# Patient Record
Sex: Female | Born: 1975 | Race: White | Hispanic: No | Smoking: Former smoker
Health system: Southern US, Community
[De-identification: ages and names within clinical notes are randomized; demographics above are authoritative.]

## PROBLEM LIST (undated history)

## (undated) HISTORY — PX: OTHER SURGICAL HISTORY: SHX169

## (undated) HISTORY — PX: TUBAL LIGATION: SHX77

---
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
## 2006-08-13 ENCOUNTER — Encounter: Payer: Self-pay | Admitting: Family Medicine

## 2007-11-17 ENCOUNTER — Ambulatory Visit: Payer: Self-pay | Admitting: Family Medicine

## 2007-11-18 LAB — CONVERTED CEMR LAB
Albumin: 4.9 g/dL (ref 3.5–5.2)
CO2: 21 meq/L (ref 19–32)
Calcium: 9.4 mg/dL (ref 8.4–10.5)
Cholesterol: 119 mg/dL (ref 0–200)
Glucose, Bld: 85 mg/dL (ref 70–99)
LDL Cholesterol: 54 mg/dL (ref 0–99)
Potassium: 3.9 meq/L (ref 3.5–5.3)
Sodium: 139 meq/L (ref 135–145)
Total Protein: 8 g/dL (ref 6.0–8.3)
Triglycerides: 51 mg/dL (ref <150)

## 2008-01-25 ENCOUNTER — Ambulatory Visit: Payer: Self-pay | Admitting: Family Medicine

## 2008-01-25 DIAGNOSIS — B353 Tinea pedis: Secondary | ICD-10-CM

## 2008-02-26 ENCOUNTER — Ambulatory Visit: Payer: Self-pay | Admitting: Family Medicine

## 2008-04-01 ENCOUNTER — Ambulatory Visit: Payer: Self-pay | Admitting: Family Medicine

## 2008-04-02 ENCOUNTER — Encounter: Payer: Self-pay | Admitting: Family Medicine

## 2008-04-22 ENCOUNTER — Ambulatory Visit: Payer: Self-pay | Admitting: Family Medicine

## 2008-04-25 ENCOUNTER — Encounter: Payer: Self-pay | Admitting: Family Medicine

## 2008-07-18 ENCOUNTER — Telehealth: Payer: Self-pay | Admitting: Family Medicine

## 2008-07-18 DIAGNOSIS — J45990 Exercise induced bronchospasm: Secondary | ICD-10-CM | POA: Insufficient documentation

## 2008-08-25 ENCOUNTER — Ambulatory Visit: Payer: Self-pay | Admitting: Family Medicine

## 2008-08-25 DIAGNOSIS — M25569 Pain in unspecified knee: Secondary | ICD-10-CM

## 2008-08-29 ENCOUNTER — Telehealth: Payer: Self-pay | Admitting: Family Medicine

## 2008-09-15 ENCOUNTER — Encounter: Payer: Self-pay | Admitting: Family Medicine

## 2008-09-15 ENCOUNTER — Encounter: Admission: RE | Admit: 2008-09-15 | Discharge: 2008-09-15 | Payer: Self-pay | Admitting: Sports Medicine

## 2008-10-06 ENCOUNTER — Encounter: Admission: RE | Admit: 2008-10-06 | Discharge: 2008-11-02 | Payer: Self-pay | Admitting: Sports Medicine

## 2008-11-29 ENCOUNTER — Encounter: Payer: Self-pay | Admitting: Family Medicine

## 2009-09-12 ENCOUNTER — Ambulatory Visit: Payer: Self-pay | Admitting: Family Medicine

## 2009-09-12 DIAGNOSIS — K219 Gastro-esophageal reflux disease without esophagitis: Secondary | ICD-10-CM | POA: Insufficient documentation

## 2009-09-21 ENCOUNTER — Other Ambulatory Visit: Admission: RE | Admit: 2009-09-21 | Discharge: 2009-09-21 | Payer: Self-pay | Admitting: Family Medicine

## 2009-09-21 ENCOUNTER — Ambulatory Visit: Payer: Self-pay | Admitting: Family Medicine

## 2009-09-29 LAB — CONVERTED CEMR LAB
Pap Smear: NEGATIVE
Pap Smear: NORMAL

## 2010-03-06 NOTE — Assessment & Plan Note (Signed)
Summary: CPE   Vital Signs:  Patient profile:   35 year old female Height:      68 inches Weight:      160 pounds Pulse rate:   74 / minute BP sitting:   124 / 73  (left arm) Cuff size:   regular  Vitals Entered By: Avon Gully CMA, Duncan Dull) (September 21, 2009 4:12 PM) CC: cpe, pap   Primary Care Zawadi Aplin:  Nani Gasser MD  CC:  cpe and pap.  History of Present Illness: Her knee is better.   Current Medications (verified): 1)  Antivert 25 Mg Tabs (Meclizine Hcl) .... One By Mouth As Needed Eveyr 4-6 Hours Fo Rdizziness. 2)  Proventil Hfa 108 (90 Base) Mcg/act Aers (Albuterol Sulfate) .... 2 Puff Inhaed Every 4-6 Hours As Needed For Wheezing.  Allergies (verified): No Known Drug Allergies  Comments:  Nurse/Medical Assistant: The patient's medications and allergies were reviewed with the patient and were updated in the Medication and Allergy Lists. Avon Gully CMA, Duncan Dull) (September 21, 2009 4:12 PM)  Past History:  Past Medical History: Last updated: 11/17/2007 None  Mirena IUD.    Past Surgical History: Last updated: 11/17/2007 Tonsils and adenoid 1979  Family History: FAther with Hi cholesterol Mother with HTN GF with lung CA, MI GM with ALS GM with Lung Ca, smoker  Social History: Reviewed history from 11/17/2007 and no changes required. 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  Marries to [place] with 2 kids.   Former Smoker Alcohol use-yes, rarely Drug use-no Regular exercise-yes  Review of Systems  The patient denies anorexia, fever, weight loss, weight gain, vision loss, decreased hearing, hoarseness, chest pain, syncope, dyspnea on exertion, peripheral edema, prolonged cough, headaches, hemoptysis, abdominal pain, melena, hematochezia, severe indigestion/heartburn, hematuria, incontinence, genital sores, muscle weakness, suspicious skin lesions, transient blindness, difficulty walking, depression, unusual weight change, abnormal bleeding,  enlarged lymph nodes, and breast masses.    Physical Exam  General:  Well-developed,well-nourished,in no acute distress; alert,appropriate and cooperative throughout examination Head:  Normocephalic and atraumatic without obvious abnormalities. No apparent alopecia or balding. Eyes:  No corneal or conjunctival inflammation noted. EOMI. Perrla.  Ears:  External ear exam shows no significant lesions or deformities.  Otoscopic examination reveals clear canals, tympanic membranes are intact bilaterally without bulging, retraction, inflammation or discharge. Hearing is grossly normal bilaterally. Nose:  External nasal examination shows no deformity or inflammation. Nasal mucosa are pink and moist without lesions or exudates. Mouth:  Oral mucosa and oropharynx without lesions or exudates.  Teeth in good repair. Neck:  No deformities, masses, or tenderness noted. Chest Wall:  No deformities, masses, or tenderness noted. Breasts:  No mass, nodules, thickening, tenderness, bulging, retraction, inflamation, nipple discharge or skin changes noted.   Lungs:  Normal respiratory effort, chest expands symmetrically. Lungs are clear to auscultation, no crackles or wheezes. Heart:  Normal rate and regular rhythm. S1 and S2 normal without gallop, murmur, click, rub or other extra sounds. Abdomen:  Bowel sounds positive,abdomen soft and non-tender without masses, organomegaly or hernias noted.   Genitalia:  Normal introitus for age, no external lesions, no vaginal discharge, mucosa pink and moist, no vaginal or cervical lesions, no vaginal atrophy, no friaility or hemorrhage, normal uterus size and position, no adnexal masses or tenderness. sTring present from her mirena IUD.  Msk:  No deformity or scoliosis noted of thoracic or lumbar spine.   Pulses:  R and L carotid,radial,dorsalis pedis and posterior tibial pulses are full and equal bilaterally Extremities:  No clubbing, cyanosis, edema, or deformity noted with  normal full range of motion of all joints.   Neurologic:  No cranial nerve deficits noted. Station and gait are normal.. Sensory, motor and coordinative functions appear intact. Skin:  no rashes.   Cervical Nodes:  No lymphadenopathy noted Axillary Nodes:  No palpable lymphadenopathy Psych:  Cognition and judgment appear intact. Alert and cooperative with normal attention span and concentration. No apparent delusions, illusions, hallucinations   Impression & Recommendations:  Problem # 1:  ROUTINE GYNECOLOGICAL EXAMINATION (ICD-V72.31) Consider screening labs Exam is normal Will f/u pap results.  Tdap is up to date.  Orders: T-Comprehensive Metabolic Panel [phone]) T-Lipid Profile ([id number])  Problem # 2:  GERD (ICD-530.81) GAgging and choking in the middle of the night. not better on the kapidex. She wants to try coming off everything and see if it helps. If not she will call me in 1-2 weeks and can consider referral to GI.    Complete Medication List: 1)  Antivert 25 Mg Tabs (Meclizine hcl) .... One by mouth as needed eveyr 4-6 hours fo rdizziness. 2)  Proventil Hfa 108 (90 Base) Mcg/act Aers (Albuterol sulfate) .... 2 puff inhaed every 4-6 hours as needed for wheezing.  Patient Instructions: 1)  Keep up the exercise 2)  Take calcium +vitamin D daily.  3)  We will call you with your results next week.

## 2010-03-06 NOTE — Assessment & Plan Note (Signed)
Summary: GERD   Vital Signs:  Patient profile:   35 year old female Height:      68 inches Weight:      163 pounds BMI:     24.87 Pulse rate:   69 / minute BP sitting:   112 / 75  Vitals Entered By: Avon Gully CMA, Duncan Dull) (September 12, 2009 3:57 PM) CC: acid reflux,pt has been on prilosec for 4-6 months and still has reflux 6x a week   Primary Care Provider:  Nani Gasser MD  CC:  acid reflux and pt has been on prilosec for 4-6 months and still has reflux 6x a week.  History of Present Illness: acid reflux,pt has been on prilosec for 4-6 months and still has reflux 6x a week. Does take reflux med before breakfast. Pepcid and Tagamet didn't help at all. Wakes up at night wakes up choking and gagging.  Doesn' t bother her during the day. Does wake up wiht ST.  Started during rpegnancy with her daughter and never really went away.  + brash.  No vomiting.  Has cut out coffee after noon. Has 2 cups a day.  Still has one soda most days.  Denies any allergy symptoms.    Current Medications (verified): 1)  Antivert 25 Mg Tabs (Meclizine Hcl) .... One By Mouth As Needed Eveyr 4-6 Hours Fo Rdizziness. 2)  Proventil Hfa 108 (90 Base) Mcg/act Aers (Albuterol Sulfate) .... 2 Puff Inhaed Every 4-6 Hours As Needed For Wheezing.  Allergies (verified): No Known Drug Allergies  Comments:  Nurse/Medical Assistant: The patient's medications and allergies were reviewed with the patient and were updated in the Medication and Allergy Lists. Avon Gully CMA, Duncan Dull) (September 12, 2009 3:58 PM)  Physical Exam  General:  Well-developed,well-nourished,in no acute distress; alert,appropriate and cooperative throughout examination Head:  Normocephalic and atraumatic without obvious abnormalities. No apparent alopecia or balding. Lungs:  Normal respiratory effort, chest expands symmetrically. Lungs are clear to auscultation, no crackles or wheezes. Heart:  Normal rate and regular rhythm. S1  and S2 normal without gallop, murmur, click, rub or other extra sounds. Abdomen:  Bowel sounds positive,abdomen soft and non-tender without masses, organomegaly or hernias noted. Skin:  no rashes.   Psych:  Cognition and judgment appear intact. Alert and cooperative with normal attention span and concentration. No apparent delusions, illusions, hallucinations   Impression & Recommendations:  Problem # 1:  GERD (ICD-530.81) Discussed changing tx to a different PPI. Gave samples of Kapidex to try for 10 day. Call if not better. Also dicussed diet modifications as well. If still not helping then please le me know and will refer to GI for further evaluation. Also consider may be allergies.  Can consider trial of antihistamine.  Can also try taking the PPI at night as well.    Complete Medication List: 1)  Antivert 25 Mg Tabs (Meclizine hcl) .... One by mouth as needed eveyr 4-6 hours fo rdizziness. 2)  Proventil Hfa 108 (90 Base) Mcg/act Aers (Albuterol sulfate) .... 2 puff inhaed every 4-6 hours as needed for wheezing.  Patient Instructions: 1)  Try the Dexilant once a day. If not better in 10 days then let me know.

## 2010-07-26 ENCOUNTER — Encounter: Payer: Self-pay | Admitting: Family Medicine

## 2010-07-27 ENCOUNTER — Ambulatory Visit (INDEPENDENT_AMBULATORY_CARE_PROVIDER_SITE_OTHER): Payer: PRIVATE HEALTH INSURANCE | Admitting: Family Medicine

## 2010-07-27 ENCOUNTER — Encounter: Payer: Self-pay | Admitting: Family Medicine

## 2010-07-27 VITALS — BP 117/74 | HR 71 | Ht 68.0 in | Wt 154.0 lb

## 2010-07-27 DIAGNOSIS — R1013 Epigastric pain: Secondary | ICD-10-CM

## 2010-07-27 MED ORDER — DEXLANSOPRAZOLE 60 MG PO CPDR: 60.0000 mg | DELAYED_RELEASE_CAPSULE | Freq: Every day | ORAL | Status: DC

## 2010-07-27 NOTE — Progress Notes (Signed)
  Subjective:    Patient ID: Marissa Schwartz, female    DOB: [birth year], 35 y.o.   MRN: [id number]  HPI  [facility] a half marathoin training program.  Pain starts in the epigastric area  And radiates around the right side. Dull ache all the time but sometimes worse.  Worse when running. No relationship to eating.  No nausea or vomiting.  Not positional.  Hx of acid reflux on and off for several years.  Did have reflux a couple of days. No other exacerbating or alleviating factors. He does not seem to be related at all to eating or not eating. She has no prior history of gallbladder problems.  Stressed-.   Review of Systems     Objective:   Physical Exam  Constitutional: She is oriented to person, place, and time. She appears well-developed and well-nourished.  HENT:  Head: Normocephalic and atraumatic.  Right Ear: External ear normal.  Left Ear: External ear normal.  Mouth/Throat: Oropharynx is clear and moist.  Eyes: Conjunctivae are normal. Pupils are equal, round, and reactive to light.  Neck: Neck supple. No thyromegaly present.  Cardiovascular: Normal rate, regular rhythm and normal heart sounds.   Pulmonary/Chest: Effort normal and breath sounds normal.  Abdominal: Soft. Bowel sounds are normal. She exhibits no distension and no mass. There is tenderness. There is no rebound and no guarding.       Tender in the epigastrium  Musculoskeletal: She exhibits edema.  Lymphadenopathy:    She has no cervical adenopathy.  Neurological: She is alert and oriented to person, place, and time.  Skin: Skin is warm and dry.  Psychiatric: She has a normal mood and affect.          Assessment & Plan:  Epigastric pain-this could be GERD or gastritis. She has had a lot of more frequent reflux symptoms this week. She feels like she has been controlling her diet well. I gave her some samples of Paxil on to start tomorrow morning. In the meantime we'll check some blood work to make sure that her  liver enzymes are normal, since her pain does occasionally radiate to the right. This is very unlikely to be pancreatitis. It is not related to eating or not eating and is unlikely to be her gallbladder but if her labs are normal and if she does not improve on the PPI then we might consider getting an abdominal ultrasound. She has not been short of breath so pneumonia is unlikely.

## 2010-07-28 LAB — CBC WITH DIFFERENTIAL/PLATELET
Eosinophils Absolute: 0.1 10*3/uL (ref 0.0–0.7)
Eosinophils Relative: 1 % (ref 0–5)
Lymphs Abs: 2 10*3/uL (ref 0.7–4.0)
MCH: 30.2 pg (ref 26.0–34.0)
MCV: 91.9 fL (ref 78.0–100.0)
Monocytes Absolute: 0.4 10*3/uL (ref 0.1–1.0)
Monocytes Relative: 6 % (ref 3–12)
Platelets: 235 10*3/uL (ref 150–400)
RBC: 4.3 MIL/uL (ref 3.87–5.11)

## 2010-07-28 LAB — COMPLETE METABOLIC PANEL WITH GFR
ALT: 10 U/L (ref 0–35)
AST: 17 U/L (ref 0–37)
Calcium: 10 mg/dL (ref 8.4–10.5)
Chloride: 101 mEq/L (ref 96–112)
Creat: 0.98 mg/dL (ref 0.50–1.10)
Potassium: 4 mEq/L (ref 3.5–5.3)

## 2010-07-29 ENCOUNTER — Telehealth: Payer: Self-pay | Admitting: Family Medicine

## 2010-07-29 NOTE — Telephone Encounter (Signed)
Call pt: lbood work is nl. If she is not better on the sample then recommend abdominal US to eval the GB. If the samples are helping then will need a rx for a PPI.

## 2010-07-30 ENCOUNTER — Other Ambulatory Visit: Payer: Self-pay | Admitting: Family Medicine

## 2010-07-30 MED ORDER — DEXLANSOPRAZOLE 60 MG PO CPDR: 60.0000 mg | DELAYED_RELEASE_CAPSULE | Freq: Every day | ORAL | Status: AC

## 2010-07-30 NOTE — Telephone Encounter (Signed)
Pt advised of results. Pt states the dexilant worked very well, therefore it was called into her pharm [phone].[facility])

## 2010-10-04 ENCOUNTER — Encounter: Payer: Self-pay | Admitting: Emergency Medicine

## 2010-10-04 ENCOUNTER — Inpatient Hospital Stay (INDEPENDENT_AMBULATORY_CARE_PROVIDER_SITE_OTHER)
Admission: RE | Admit: 2010-10-04 | Discharge: 2010-10-04 | Disposition: A | Discharge status: Home / Self Care | Payer: PRIVATE HEALTH INSURANCE | Source: Ambulatory Visit | Attending: Emergency Medicine | Admitting: Emergency Medicine

## 2010-10-04 DIAGNOSIS — K645 Perianal venous thrombosis: Secondary | ICD-10-CM

## 2010-10-04 DIAGNOSIS — K625 Hemorrhage of anus and rectum: Secondary | ICD-10-CM | POA: Insufficient documentation

## 2010-10-07 ENCOUNTER — Telehealth (INDEPENDENT_AMBULATORY_CARE_PROVIDER_SITE_OTHER): Payer: Self-pay | Admitting: Emergency Medicine

## 2010-10-20 ENCOUNTER — Telehealth (INDEPENDENT_AMBULATORY_CARE_PROVIDER_SITE_OTHER): Payer: Self-pay | Admitting: Emergency Medicine

## 2010-10-29 ENCOUNTER — Ambulatory Visit (INDEPENDENT_AMBULATORY_CARE_PROVIDER_SITE_OTHER): Payer: PRIVATE HEALTH INSURANCE | Admitting: Family Medicine

## 2010-10-29 ENCOUNTER — Ambulatory Visit
Admission: RE | Admit: 2010-10-29 | Discharge: 2010-10-29 | Disposition: A | Discharge status: Home / Self Care | Payer: PRIVATE HEALTH INSURANCE | Source: Ambulatory Visit | Attending: Family Medicine | Admitting: Family Medicine

## 2010-10-29 ENCOUNTER — Encounter: Payer: Self-pay | Admitting: Family Medicine

## 2010-10-29 VITALS — BP 124/77 | HR 105 | Temp 98.7°F | Wt 155.0 lb

## 2010-10-29 DIAGNOSIS — J189 Pneumonia, unspecified organism: Secondary | ICD-10-CM

## 2010-10-29 DIAGNOSIS — R062 Wheezing: Secondary | ICD-10-CM

## 2010-10-29 MED ORDER — ALBUTEROL SULFATE (2.5 MG/3ML) 0.083% IN NEBU
2.5000 mg | INHALATION_SOLUTION | Freq: Four times a day (QID) | RESPIRATORY_TRACT | Status: DC | PRN
  Administered 2010-10-29: 2.5 mg via RESPIRATORY_TRACT

## 2010-10-29 MED ORDER — ALBUTEROL SULFATE HFA 108 (90 BASE) MCG/ACT IN AERS: 2.0000 | INHALATION_SPRAY | Freq: Four times a day (QID) | RESPIRATORY_TRACT | Status: DC | PRN

## 2010-10-29 MED ORDER — PREDNISONE 20 MG PO TABS: 20.0000 mg | ORAL_TABLET | Freq: Two times a day (BID) | ORAL | Status: AC

## 2010-10-29 NOTE — Progress Notes (Signed)
  Subjective:    Patient ID: Marissa Schwartz, female    DOB: March 05, 1975, 35 y.o.   MRN: [id number]  HPI Had a Gi bug adn then started coughing 2 weeks ago.  Had no nasal sxs. Was getting SOB as well.  Went to employee health.  Did Avelox for 10 days.  Temp to 99.9.  Still coughing a lot and the SOB is a little better. Feels really fatigued. No ear pain or pressure. Some mild nasal congestion. No ST.  Cough is wet and dry.  Yesterday coughed so hard she threw up.    Review of Systems     Objective:   Physical Exam  Constitutional: She is oriented to person, place, and time. She appears well-developed and well-nourished.  HENT:  Head: Normocephalic and atraumatic.  Right Ear: External ear normal.  Left Ear: External ear normal.  Nose: Nose normal.  Mouth/Throat: Oropharynx is clear and moist.       TMs and canals are clear.   Eyes: Conjunctivae and EOM are normal. Pupils are equal, round, and reactive to light.  Neck: Neck supple. No thyromegaly present.  Cardiovascular: Normal rate, regular rhythm and normal heart sounds.   Pulmonary/Chest: Effort normal. No respiratory distress. She has wheezes.       Expiratory wheezes at the bases bilaterally.  + rhonchi bilaterally.   Lymphadenopathy:    She has no cervical adenopathy.  Neurological: She is alert and oriented to person, place, and time.  Skin: Skin is warm and dry.  Psychiatric: She has a normal mood and affect.          Assessment & Plan:  Cough - Peak flow 360 initially. Given NEB tx.  Post peak flow is 360.  Says she used to wheeze with exercise when she was obese (200 lbs). Post neb wheezing resolved but still bilat rhonchi. Sent for CXR. She already has cough med at home. Will call with xray results. For now recommend use albuterol 2 puffs tid and then wean down.

## 2010-10-30 ENCOUNTER — Telehealth: Payer: Self-pay | Admitting: *Deleted

## 2010-10-30 NOTE — Telephone Encounter (Signed)
Notified pt of results and MD instructions via VM. Instructed pt to call back if any questions. KJ LPN

## 2010-10-30 NOTE — Telephone Encounter (Signed)
Called and left message on pt home and cell with results

## 2010-10-30 NOTE — Telephone Encounter (Signed)
Message copied by Lanae Crumbly on Tue Oct 30, 2010  3:36 PM ------      Message from: Nani Gasser D      Created: Mon Oct 29, 2010  8:49 PM       See note below and also will call in course of steroids.

## 2010-10-30 NOTE — Telephone Encounter (Signed)
Message copied by Wyline Beady on Tue Oct 30, 2010 10:21 AM ------      Message from: Nani Gasser D      Created: Mon Oct 29, 2010  5:46 PM       CXR is nl. No infection. Thus recommend to use the inhaler 2 puff 3 x a day for 3 days then 2 times a day for 3 days., then once a day for 3 days.  If suddenly get worse then let me know but doesn't need any more abx.

## 2011-01-07 NOTE — Progress Notes (Signed)
Summary: Bleeding Hemorrhoids (rm 2)   Vital Signs:  Patient Profile:   35 Years Old Female Height:     68 inches Weight:      156 pounds O2 Sat:      100 % O2 treatment:    Room Air Temp:     98.4 degrees F oral Pulse rate:   76 / minute Resp:     16 per minute BP sitting:   113 / 75  (left arm) Cuff size:   regular  Vitals Entered By: Lajean Saver RN (October 04, 2010 8:57 AM)                  Updated Prior Medication List: PROVENTIL HFA 108 (90 BASE) MCG/ACT AERS (ALBUTEROL SULFATE) 2 puff inhaed every 4-6 hours as needed for wheezing.  Current Allergies: No known allergies History of Present Illness History from: patient Chief Complaint: rectal bleeding History of Present Illness: bright red rectal bleeding for 24 hours. Three days prior to this, had had quote food poisoning" with some abdominal pain and loose watery stools, but all the abdominal pain and diarrhea has completely resolved and bowel movements or otherwise formed and normal. Last normal formed Brown bowel movement was this morning.  However, bright red blood from rectum started 24 hours ago associated with a lump in the external anal area, and she feels she has a hemorrhoid. It's mildly painful. The bleeding has decreased somewhat. She tried talks and that helped the edge somewhat.  She denies any bleeding disorders. Denies chance of pregnancy. Denies any GYN symptoms. Had normal GYN exam with her gynecologist two weeks ago. She's preparing for a half marathon in three days, and requests minor surgery on the hemorrhoid by me, here in our facility at this time. currently, denies any abdominal pain, back pain, chest pain, dyspnea, fever,lightheadedness, syncope, and otherwise feels well.  REVIEW OF SYSTEMS Constitutional Symptoms      Denies fever, chills, night sweats, weight loss, weight gain, and fatigue.  Eyes       Denies change in vision, eye pain, eye discharge, glasses, contact lenses, and eye  surgery. Ear/Nose/Throat/Mouth       Denies hearing loss/aids, change in hearing, ear pain, ear discharge, dizziness, frequent runny nose, frequent nose bleeds, sinus problems, sore throat, hoarseness, and tooth pain or bleeding.  Respiratory       Denies dry cough, productive cough, wheezing, shortness of breath, asthma, bronchitis, and emphysema/COPD.  Cardiovascular       Denies murmurs, chest pain, and tires easily with exhertion.    Gastrointestinal       Denies stomach pain, nausea/vomiting, diarrhea, constipation, blood in bowel movements, and indigestion. Genitourniary       Denies painful urination, blood or discharge from vagina, kidney stones, and loss of urinary control. Neurological       Denies paralysis, seizures, and fainting/blackouts. Musculoskeletal       Denies muscle pain, joint pain, joint stiffness, decreased range of motion, redness, swelling, muscle weakness, and gout.  Skin       Denies bruising, unusual mles/lumps or sores, and hair/skin or nail changes.  Psych       Denies mood changes, temper/anger issues, anxiety/stress, speech problems, depression, and sleep problems. Other Comments: Food poisoning on Saturaday. Hemorrhoid Monday that started bleeding Wednesday and has bled for the past 24 hours. Blood is bright red. Last BM this AM.    Past History:  Past Medical History: None  Mirena IUD.  bilateral hearing loss  Past Surgical History: Reviewed history from 11/17/2007 and no changes required. Tonsils and adenoid 1979  Family History: Reviewed history from 09/21/2009 and no changes required. FAther with Hi cholesterol Mother with HTN GF with lung CA, MI GM with ALS GM with Lung Ca, smoker  Social History: Reviewed history from 09/21/2009 and no changes required. 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   Former Smoker Alcohol use-yes, rarely Drug use-no Regular exercise-yes Physical Exam General  appearance: well developed, well nourished, no acute distress Head: normocephalic, atraumatic Ears: normal, no lesions or deformities Oral/Pharynx: tongue normal, posterior pharynx without erythema or exudate Neck: neck supple,  trachea midline, no masses Chest/Lungs: no rales, wheezes, or rhonchi bilateral, breath sounds equal without effort Heart: regular rate and  rhythm, no murmur Abdomen: soft, non-tender without obvious organomegaly GU: deferred Extremities: normal extremities Neurological: grossly intact and non-focal Skin: no obvious rashes or lesions with nurse chaperone (KL), anal/rectal exam performed in the right lateral decubitus position. There were two large, very tender thrombosed external hemorrhoids with exposed old blood clots. No other lesions seen or palpated Assessment New Problems: EXTERNAL HEMORRHOIDS, THROMBOSED (ICD-455.4) RECTAL BLEEDING (ICD-569.3)   Plan New Orders: Removal Of Hemorrhoid Clot [46320] New Patient Level II [99202] Planning Comments:   we discussed diagnosis as well as treatment options. Given her age less than 40,workup for rectal bleeding is not indicated at this time unless the rectal bleeding persisted after the hemorrhoids were treated. We discussed this, and she agrees. Treatment options for the hemorrhoids discussed. Including conservative treatment options. As well as the option of my performing I&D of thrombosed external hemorrhoids. And the option of referral to a surgeon today, as well as other options. She requested that I perform I&D of thrombosed external hemorrhoids today in our facility.   Risks, benefits, alternatives discussed. Pt voiced understanding and agreement.  verbal informed consent obtained.   The patient and/or caregiver has been counseled thoroughly with regard to medications prescribed including dosage, schedule, interactions, rationale for use, and possible side effects and they verbalize understanding.  Diagnoses  and expected course of recovery discussed and will return if not improved as expected or if the condition worsens. Patient and/or caregiver verbalized understanding.   PROCEDURE:   I & D Site: thrombosed external hemorrhoids, one on right and one on left lateral anal area,  Size: 2.5 cm on right, 1.5 cm on left Anesthesia: 2% plain Xylocaine Procedure: informed consent obtained.  assistant/chaperone: KL in right lateral decubitus position, using number 11 scalpel, incision and drainage of each thrombosed external hemorrhoid. Patient tolerated well. Bandage applied. Disposition: Return to clinic as Instructed by MD. Anticipatory guidance & wound care discussed. Sitz baths. Patient advised to expect some more bleeding and clots to come out over the next 1 to 2 days, which should decrease and hopefully resolve by the third day. Discussed the anticipated healing process. Recheck if not significantly better in 2 days. she declined any prescription pain medication and prefers to use Tylenol or ibuprofen OTC PRN pain. Rx: Cephalexin 500 B ID X 5 days because of risk of infection.  Orders Added: 1)  Removal Of Hemorrhoid Clot [46320] 2)  New Patient Level II [40981]

## 2011-01-07 NOTE — Telephone Encounter (Signed)
  Phone Note Outgoing Call   Call placed by: Lavell Islam RN,  October 07, 2010 4:22 PM Call placed to: Patient Action Taken: Phone Call Completed Summary of Call: Left message on voice mail inquiring about patient's status and encouraging her to call with questions/concerns. Initial call taken by: Lavell Islam RN,  October 07, 2010 4:22 PM

## 2011-01-07 NOTE — Telephone Encounter (Signed)
  Phone Note Call from Patient   Caller: Patient Call For: advice Summary of Call: Call from patient stating she was dx with "walking pneumonia" on 10-17-10 at employee health [facility]; placed on ABX and cough med; fever down, but still feeling tired and cough productive with some SOB; definitely better. Spoke with Dr.Henderson who reviewed info and relayed that this sounds like normal course, she should contact Dr.Metheney for f/u in week or so. Patient seems satisfied. Initial call taken by: Lavell Islam RN,  October 20, 2010 12:02 PM

## 2011-07-31 ENCOUNTER — Encounter: Payer: Self-pay | Admitting: Physician Assistant

## 2011-07-31 ENCOUNTER — Ambulatory Visit (INDEPENDENT_AMBULATORY_CARE_PROVIDER_SITE_OTHER): Payer: PRIVATE HEALTH INSURANCE | Admitting: Physician Assistant

## 2011-07-31 VITALS — BP 112/72 | HR 71 | Ht 68.0 in | Wt 164.0 lb

## 2011-07-31 DIAGNOSIS — S63502A Unspecified sprain of left wrist, initial encounter: Secondary | ICD-10-CM

## 2011-07-31 DIAGNOSIS — S63509A Unspecified sprain of unspecified wrist, initial encounter: Secondary | ICD-10-CM

## 2011-07-31 MED ORDER — NAPROXEN 500 MG PO TABS: 500.0000 mg | ORAL_TABLET | Freq: Two times a day (BID) | ORAL | Status: DC

## 2011-07-31 NOTE — Progress Notes (Signed)
  Subjective:    Patient ID: ERETRIA MANTERNACH, female    DOB: 1975-08-16, 36 y.o.   MRN: [id number]  HPI Patient presents to clinic with left wrist pain for last 2 weeks. She went to employee health and they gave her naproxen and a brace to wear. She does feel like it has gotten better but wanted it to be checked today. She denies any trauma or fall to left wrist. The pain is worse with movement and grabbing things. She has dropped numerous things because of the pain. Pain on average 3/10 and with naproxen a lot less. She occasionally has some numbness and tingling of fingers but rarely. No elbow pain or neck pain.    Review of Systems     Objective:   Physical Exam  Constitutional: She appears well-developed and well-nourished.  HENT:  Head: Normocephalic and atraumatic.  Musculoskeletal:       Normal ROM of left wrist. Handgrip 5/5 bilaterally. Neg Tinels and phalens. No swelling or color noted along the wrist. Slight pull with Lourena Simmonds test. Tenderess to palpation along the medial side around wrist and up forearm about 4 cm.    Psychiatric: She has a normal mood and affect. Her behavior is normal.          Assessment & Plan:  Left wrist strain- Went over wrist strectches to complete daily. Refilled Naproxen to use twice a day. Encouraged patient to wear brace 24hr until 1 week after pain stops. Ice as needed. Should be improving daily. May take some times. If worsens or does not improve in next 3 weeks call office.

## 2011-07-31 NOTE — Patient Instructions (Addendum)
Continue Naprosyn twice a day for next 2 weeks. Wear brace 24 hours a day for next 2-3 weeks. Call office if not improving. Don't forget icing.

## 2011-10-18 ENCOUNTER — Other Ambulatory Visit (HOSPITAL_COMMUNITY)
Admission: RE | Admit: 2011-10-18 | Discharge: 2011-10-18 | Disposition: A | Discharge status: Home / Self Care | Payer: PRIVATE HEALTH INSURANCE | Source: Ambulatory Visit | Attending: Family Medicine | Admitting: Family Medicine

## 2011-10-18 ENCOUNTER — Ambulatory Visit (INDEPENDENT_AMBULATORY_CARE_PROVIDER_SITE_OTHER): Payer: PRIVATE HEALTH INSURANCE | Admitting: Family Medicine

## 2011-10-18 ENCOUNTER — Encounter: Payer: Self-pay | Admitting: Family Medicine

## 2011-10-18 VITALS — BP 107/71 | HR 74 | Ht 68.0 in | Wt 160.0 lb

## 2011-10-18 DIAGNOSIS — Z01419 Encounter for gynecological examination (general) (routine) without abnormal findings: Secondary | ICD-10-CM | POA: Insufficient documentation

## 2011-10-18 LAB — CBC
Hemoglobin: 13.2 g/dL (ref 12.0–15.0)
MCH: 30.1 pg (ref 26.0–34.0)
MCV: 88.4 fL (ref 78.0–100.0)
RBC: 4.38 MIL/uL (ref 3.87–5.11)
WBC: 5.8 10*3/uL (ref 4.0–10.5)

## 2011-10-18 MED ORDER — ALBUTEROL SULFATE HFA 108 (90 BASE) MCG/ACT IN AERS: 2.0000 | INHALATION_SPRAY | Freq: Four times a day (QID) | RESPIRATORY_TRACT | Status: DC | PRN

## 2011-10-18 NOTE — Progress Notes (Signed)
  Subjective:     Marissa Schwartz is a 36 y.o. female and is here for a comprehensive physical exam. The patient reports no problems. She does report feeling cold at times would like to have her thyroid checked. Has been to in the past and is normal. But she would like to just make sure.  History   Social History  . Marital Status: Married    Spouse Name: N/A    Number of Children: 2  . Years of Education: N/A   Occupational History  . [employer]   Social History Main Topics  . Smoking status: Former Smoker    Quit date: 02/04/1993  . Smokeless tobacco: Not on file  . Alcohol Use: Yes     rarely  . Drug Use: No  . 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   Other Topics Concern  . Not on file   Social History Narrative   RE[employer]ular exercise.    Health Maintenance  Topic Date Due  . Influenza Vaccine  11/05/2011  . Pap Smear  10/18/2014  . Tetanus/tdap  11/06/2017    The following portions of the patient's history were reviewed and updated as appropriate: allergies, current medications, past family history, past medical history, past social history, past surgical history and problem list.  Review of Systems A comprehensive review of systems was negative.   Objective:    BP 107/71  Pulse 74  Ht 5\' 8"  (1.727 m)  Wt 160 lb (72.576 kg)  BMI 24.33 kg/m2  SpO2 100% General appearance: alert, cooperative and appears stated age Head: Normocephalic, without obvious abnormality, atraumatic Eyes: conj clear, EOMI, PEERLA Ears: normal TM's and external ear canals both ears Nose: Nares normal. Septum midline. Mucosa normal. No drainage or sinus tenderness. Throat: lips, mucosa, and tongue normal; teeth and gums normal Neck: no adenopathy, no carotid bruit, no JVD, supple, symmetrical, trachea midline and thyroid not enlarged, symmetric, no tenderness/mass/nodules Back: symmetric, no curvature. ROM normal. No CVA  tenderness. Lungs: clear to auscultation bilaterally Breasts: normal appearance, no masses or tenderness Heart: regular rate and rhythm, S1, S2 normal, no murmur, click, rub or gallop Abdomen: soft, non-tender; bowel sounds normal; no masses,  no organomegaly Pelvic: cervix normal in appearance, external genitalia normal, no adnexal masses or tenderness, no cervical motion tenderness, rectovaginal septum normal, uterus normal size, shape, and consistency, vagina normal without discharge and mirena string in place Extremities: extremities normal, atraumatic, no cyanosis or edema Pulses: 2+ and symmetric Skin: Skin color, texture, turgor normal. No rashes or lesions Lymph nodes: Cervical, supraclavicular, and axillary nodes normal. Neurologic: Alert and oriented X 3, normal strength and tone. Normal symmetric reflexes. Normal coordination and gait    Assessment:    Healthy female exam.      Plan:     See After Visit Summary for Counseling Recommendations  Start a regular exercise program and make sure you are eating a healthy diet Try to eat 4 servings of dairy a day or take a calcium supplement (500mg  twice a day). Your vaccines are up to date.  CMP and lipids ordered.   She will get flu shot at work.   Feels cold all the time-altered temperatur sensation-will check thyroid as well as check for anemia.

## 2011-10-18 NOTE — Patient Instructions (Addendum)
Start a regular exercise program and make sure you are eating a healthy diet Try to eat 4 servings of dairy a day  Your vaccines are up to date.   

## 2011-10-19 LAB — LIPID PANEL
HDL: 57 mg/dL (ref >39)
LDL Cholesterol: 62 mg/dL (ref 0–99)
Total CHOL/HDL Ratio: 2.3 Ratio
VLDL: 12 mg/dL (ref 0–40)

## 2011-10-19 LAB — COMPLETE METABOLIC PANEL WITH GFR
ALT: 10 U/L (ref 0–35)
AST: 16 U/L (ref 0–37)
Alkaline Phosphatase: 39 U/L (ref 39–117)
Sodium: 139 mEq/L (ref 135–145)
Total Bilirubin: 0.7 mg/dL (ref 0.3–1.2)
Total Protein: 7.6 g/dL (ref 6.0–8.3)

## 2012-03-31 ENCOUNTER — Ambulatory Visit (INDEPENDENT_AMBULATORY_CARE_PROVIDER_SITE_OTHER): Payer: PRIVATE HEALTH INSURANCE | Admitting: Family Medicine

## 2012-03-31 ENCOUNTER — Encounter: Payer: Self-pay | Admitting: Family Medicine

## 2012-03-31 VITALS — BP 118/72 | HR 78 | Ht 68.0 in | Wt 163.0 lb

## 2012-03-31 DIAGNOSIS — R531 Weakness: Secondary | ICD-10-CM

## 2012-03-31 DIAGNOSIS — R251 Tremor, unspecified: Secondary | ICD-10-CM

## 2012-03-31 DIAGNOSIS — E162 Hypoglycemia, unspecified: Secondary | ICD-10-CM

## 2012-03-31 DIAGNOSIS — R635 Abnormal weight gain: Secondary | ICD-10-CM

## 2012-03-31 LAB — POCT GLYCOSYLATED HEMOGLOBIN (HGB A1C): Hemoglobin A1C: 5.1

## 2012-03-31 NOTE — Patient Instructions (Signed)
Work on increasing your protein levels.

## 2012-03-31 NOTE — Progress Notes (Signed)
  Subjective:    Patient ID: Marissa Schwartz, female    DOB: March 06, 1975, 37 y.o.   MRN: [id number]  HPI Overweight. She's gained about 10 pounds in the last year. Hs been working on weight, diet, and using my fitness PAl and still struggling with losing weight. Has a workout partner.  So feels she has the accountability. She's been running, swimming and exercising almost daily. No skin or hair changes.  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  Does get a lot fo acne and wonders if she has PCOS.  Feels like could be having some hypoglycemic events.   Has noticed will feel shakey if has been more than a couple of hours since last meal/snack. Started about 6-8 weeks ago.    Review of Systems     Objective:   Physical Exam  Constitutional: She is oriented to person, place, and time. She appears well-developed and well-nourished.  HENT:  Head: Normocephalic and atraumatic.  Eyes: Conjunctivae are normal. Pupils are equal, round, and reactive to light.  Neck: Neck supple. No thyromegaly present.  Small and large cervical lymph node, right anterior.  Cardiovascular: Normal rate, regular rhythm and normal heart sounds.   Pulmonary/Chest: Effort normal and breath sounds normal.  Abdominal: Soft. Bowel sounds are normal. She exhibits no distension and no mass. There is no tenderness. There is no rebound and no guarding.  Lymphadenopathy:    She has cervical adenopathy.  Neurological: She is alert and oriented to person, place, and time.  Skin: Skin is warm and dry.  Psychiatric: She has a normal mood and affect. Her behavior is normal.          Assessment & Plan:  Difficulty losing weight/abnormal weight gain-we will recheck TSH. The last one was about 6 months ago that was normal. We did check her for diabetes today and it was normal. I discussed increasing her protein especially based on how much she is working out. PCO S. is certainly a possibility. She did have infertility issues and has trouble  with her weight. She does complain of acne and abnormalnHair growth on her chin  and face. We will check hormone levels. To her that PCO S. there is a clinical diagnosis. We certainly could consider treatment such as metformin. Followup in 3-4 weeks to discuss results.  Hypoglycemia-evaluate for diabetes. Her hemodynamics he was fantastic. Gave reassurance. Encouraged her to increase her protein especially with her workout routine.  Feeling shaky-certainly could be hypoglycemia as it does happen after she has not eaten for couple hours and then she feels better when she does eat. Will also check her thyroid gland as well as electrolytes.

## 2012-04-01 LAB — COMPLETE METABOLIC PANEL WITH GFR
ALT: 10 U/L (ref 0–35)
Albumin: 4.7 g/dL (ref 3.5–5.2)
Alkaline Phosphatase: 39 U/L (ref 39–117)
GFR, Est Non African American: 61 mL/min
Glucose, Bld: 84 mg/dL (ref 70–99)
Potassium: 4 mEq/L (ref 3.5–5.3)
Sodium: 136 mEq/L (ref 135–145)
Total Protein: 7.2 g/dL (ref 6.0–8.3)

## 2012-04-01 LAB — CBC
Hemoglobin: 13.4 g/dL (ref 12.0–15.0)
MCHC: 34.1 g/dL (ref 30.0–36.0)
Platelets: 192 10*3/uL (ref 150–400)
RBC: 4.48 MIL/uL (ref 3.87–5.11)

## 2012-04-01 LAB — LUTEINIZING HORMONE: LH: 2.8 m[IU]/mL

## 2012-04-01 LAB — FOLLICLE STIMULATING HORMONE: FSH: 2.2 m[IU]/mL

## 2012-09-25 ENCOUNTER — Ambulatory Visit (INDEPENDENT_AMBULATORY_CARE_PROVIDER_SITE_OTHER): Payer: PRIVATE HEALTH INSURANCE | Admitting: Family Medicine

## 2012-09-25 ENCOUNTER — Encounter: Payer: Self-pay | Admitting: Family Medicine

## 2012-09-25 VITALS — BP 102/67 | HR 82 | Ht 68.0 in | Wt 152.0 lb

## 2012-09-25 DIAGNOSIS — R634 Abnormal weight loss: Secondary | ICD-10-CM

## 2012-09-25 DIAGNOSIS — F430 Acute stress reaction: Secondary | ICD-10-CM

## 2012-09-25 LAB — CBC WITH DIFFERENTIAL/PLATELET
Basophils Absolute: 0 10*3/uL (ref 0.0–0.1)
Eosinophils Relative: 1 % (ref 0–5)
Lymphocytes Relative: 25 % (ref 12–46)
Lymphs Abs: 1.5 10*3/uL (ref 0.7–4.0)
Neutro Abs: 4 10*3/uL (ref 1.7–7.7)
Neutrophils Relative %: 67 % (ref 43–77)
Platelets: 243 10*3/uL (ref 150–400)
RBC: 4.29 MIL/uL (ref 3.87–5.11)
RDW: 13.4 % (ref 11.5–15.5)
WBC: 5.9 10*3/uL (ref 4.0–10.5)

## 2012-09-25 LAB — COMPLETE METABOLIC PANEL WITH GFR
ALT: 10 U/L (ref 0–35)
BUN: 9 mg/dL (ref 6–23)
CO2: 26 mEq/L (ref 19–32)
Calcium: 9.3 mg/dL (ref 8.4–10.5)
Chloride: 103 mEq/L (ref 96–112)
Creat: 0.99 mg/dL (ref 0.50–1.10)
GFR, Est African American: 83 mL/min
GFR, Est Non African American: 72 mL/min
Glucose, Bld: 78 mg/dL (ref 70–99)
Total Bilirubin: 1.1 mg/dL (ref 0.3–1.2)

## 2012-09-25 LAB — TSH: TSH: 0.874 u[IU]/mL (ref 0.350–4.500)

## 2012-09-25 MED ORDER — BUPROPION HCL ER (XL) 150 MG PO TB24: 150.0000 mg | ORAL_TABLET | ORAL | Status: DC

## 2012-09-25 NOTE — Progress Notes (Signed)
  Subjective:    Patient ID: Marissa Schwartz, female    DOB: [birth year], 37 y.o.   MRN: [id number]  HPI She is doing through a divorce right now.  She has dec appetite and is losing weight. She is not sleeping well. She is getting 3-4 hours, and if takes melatonin and that helps. Trying to work and taking care of the kids. Edwyna Shell has been fluttering. Hasn't been able to work out.  She feels extremely anxious and on edge all the time. She constantly is worrying. She's having trouble relaxing. She says she's a little bit more irritable than usual. She is having difficulty staying asleep. Constantly feels tired and has a poor appetite. She also feels bad about herself. She has a several days where she just feels down.     Review of Systems     Objective:   Physical Exam  Constitutional: She is oriented to person, place, and time. She appears well-developed and well-nourished.  HENT:  Head: Normocephalic and atraumatic.  Eyes: Conjunctivae and EOM are normal.  Cardiovascular: Normal rate.   Pulmonary/Chest: Effort normal.  Neurological: She is alert and oriented to person, place, and time.  Skin: Skin is dry. No pallor.  Psychiatric: She has a normal mood and affect. Her behavior is normal.          Assessment & Plan:  Acute reaction to stress - GAD-7 score of 15, PHQ- 9 score 13. We discussed different treatment options. Fortunately, she is Re: involved in counseling which I think is fantastic. Encouraged her to continue this. She sounds like she has a good support network for her friends and family as well. Also discussed starting a medication. She's not 100% sure because she would like to go ahead and send a prescription to the pharmacy and she will think about it over the weekend. We discussed the potential side effects of the medication and the risks and benefits. We'll start with Wellbutrin 150 mg extended release once a day. Followup in 3 weeks. Can continue melatonin for sleep with the  medication that should be safe.  Time spent 25 min, > 50% spent counseling about reaction

## 2012-09-25 NOTE — Progress Notes (Signed)
Quick Note:  All labs are normal. ______ 

## 2012-10-19 ENCOUNTER — Ambulatory Visit: Payer: PRIVATE HEALTH INSURANCE | Admitting: Family Medicine

## 2012-12-10 ENCOUNTER — Other Ambulatory Visit: Payer: Self-pay

## 2013-05-03 ENCOUNTER — Other Ambulatory Visit: Payer: Self-pay | Admitting: *Deleted

## 2013-05-03 MED ORDER — ALBUTEROL SULFATE HFA 108 (90 BASE) MCG/ACT IN AERS: 2.0000 | INHALATION_SPRAY | Freq: Four times a day (QID) | RESPIRATORY_TRACT | Status: DC | PRN

## 2013-06-08 ENCOUNTER — Ambulatory Visit (INDEPENDENT_AMBULATORY_CARE_PROVIDER_SITE_OTHER): Payer: PRIVATE HEALTH INSURANCE

## 2013-06-08 ENCOUNTER — Ambulatory Visit (INDEPENDENT_AMBULATORY_CARE_PROVIDER_SITE_OTHER): Payer: PRIVATE HEALTH INSURANCE | Admitting: Sports Medicine

## 2013-06-08 ENCOUNTER — Encounter: Payer: Self-pay | Admitting: Sports Medicine

## 2013-06-08 VITALS — BP 123/69 | HR 71 | Ht 68.0 in | Wt 153.0 lb

## 2013-06-08 DIAGNOSIS — R200 Anesthesia of skin: Secondary | ICD-10-CM | POA: Insufficient documentation

## 2013-06-08 DIAGNOSIS — R209 Unspecified disturbances of skin sensation: Secondary | ICD-10-CM

## 2013-06-08 MED ORDER — MELOXICAM 15 MG PO TABS: ORAL_TABLET | ORAL | Status: DC

## 2013-06-08 NOTE — Assessment & Plan Note (Signed)
Typically occurs between the second and third toes, at the beginning of runs. Has been persistent now after her recent marathon, the numbness occurred early. She does have pain with compression of the metatarsal heads suggestive of intermetatarsal bursitis versus a Morton's neuroma. Numbness is in fact in the distribution of the common digital nerve. She will return for custom orthotics with neuroma pads. Mobic, x-rays.

## 2013-06-08 NOTE — Progress Notes (Signed)
   Subjective:    I'm seeing this patient as a consultation for:  Dr. Linford ArnoldMetheney  CC: Toe numbness  HPI: This is a very pleasant 38 year old female, she recently ran a marathon, unfortunately now has numbness that she localizes between the second and third toes of her right foot. Noumbness is predominant over the lateral second toe in the medial third toe. Moderate, persistent. This occurs after nearly every run.  Past medical history, Surgical history, Family history not pertinant except as noted below, Social history, Allergies, and medications have been entered into the medical record, reviewed, and no changes needed.   Review of Systems: No headache, visual changes, nausea, vomiting, diarrhea, constipation, dizziness, abdominal pain, skin rash, fevers, chills, night sweats, weight loss, swollen lymph nodes, body aches, joint swelling, muscle aches, chest pain, shortness of breath, mood changes, visual or auditory hallucinations.   Objective:   General: Well Developed, well nourished, and in no acute distress.  Neuro/Psych: Alert and oriented x3, extra-ocular muscles intact, able to move all 4 extremities, sensation grossly intact. Skin: Warm and dry, no rashes noted.  Respiratory: Not using accessory muscles, speaking in full sentences, trachea midline.  Cardiovascular: Pulses palpable, no extremity edema. Abdomen: Does not appear distended. Right Foot: Foot inspection and palpation reveals only very mild breakdown of the transverse arch and a drop of MT heads, there is mild tenderness to palpation under the metatarsal heads. There is reproduction of pain with compression of the second and third metatarsal heads. There is also objective hypoesthesia on the lateral aspect of the second toe and the medial aspect of the third toe. Hammer toes are absent.  X-rays reviewed and are negative.  Impression and Recommendations:   This case required medical decision making of moderate  complexity.

## 2013-06-09 ENCOUNTER — Telehealth: Payer: Self-pay | Admitting: *Deleted

## 2013-06-09 MED ORDER — NAPROXEN 500 MG PO TABS: 500.0000 mg | ORAL_TABLET | Freq: Two times a day (BID) | ORAL | Status: DC

## 2013-06-09 NOTE — Telephone Encounter (Signed)
Of course, I am going to switch to rx strength Aleve.

## 2013-06-09 NOTE — Telephone Encounter (Signed)
LMOM with response.  Terrell Ostrand, LPN  

## 2013-06-09 NOTE — Telephone Encounter (Signed)
Pt states she has some issues before when she took Meloxicam and prefers not to take it. States she has been taking Aleve which helps some. Asking if there is something else you can send in place of Meloxicam.

## 2013-06-14 ENCOUNTER — Ambulatory Visit (INDEPENDENT_AMBULATORY_CARE_PROVIDER_SITE_OTHER): Payer: PRIVATE HEALTH INSURANCE | Admitting: Sports Medicine

## 2013-06-14 ENCOUNTER — Other Ambulatory Visit: Payer: Self-pay

## 2013-06-14 ENCOUNTER — Encounter: Payer: Self-pay | Admitting: Sports Medicine

## 2013-06-14 VITALS — BP 102/65 | HR 61 | Ht 68.0 in | Wt 153.0 lb

## 2013-06-14 DIAGNOSIS — R200 Anesthesia of skin: Secondary | ICD-10-CM

## 2013-06-14 DIAGNOSIS — R209 Unspecified disturbances of skin sensation: Secondary | ICD-10-CM

## 2013-06-14 NOTE — Progress Notes (Signed)
    Patient was fitted for a : standard, cushioned, semi-rigid orthotic. The orthotic was heated and afterward the patient stood on the orthotic blank positioned on the orthotic stand. The patient was positioned in subtalar neutral position and 10 degrees of ankle dorsiflexion in a weight bearing stance. After completion of molding, a stable base was applied to the orthotic blank. The blank was ground to a stable position for weight bearing. Size: 9 Base: Blue EVA Additional Posting and Padding: None The patient ambulated these, and they were very comfortable.  I spent 40 minutes with this patient, greater than 50% was face-to-face time counseling regarding the below diagnosis.   

## 2013-06-14 NOTE — Assessment & Plan Note (Signed)
Custom orthotics as above. No metatarsal pads placed as symptoms are improving. If persistent symptoms after a month we can readd the metatarsal pads. Next lung return in one month.

## 2013-07-12 ENCOUNTER — Ambulatory Visit: Payer: PRIVATE HEALTH INSURANCE | Admitting: Sports Medicine

## 2015-01-26 LAB — HM PAP SMEAR: HM Pap smear: NEGATIVE

## 2015-02-03 ENCOUNTER — Ambulatory Visit (INDEPENDENT_AMBULATORY_CARE_PROVIDER_SITE_OTHER): Payer: PRIVATE HEALTH INSURANCE

## 2015-02-03 ENCOUNTER — Encounter: Payer: Self-pay | Admitting: Sports Medicine

## 2015-02-03 ENCOUNTER — Ambulatory Visit (INDEPENDENT_AMBULATORY_CARE_PROVIDER_SITE_OTHER): Payer: PRIVATE HEALTH INSURANCE | Admitting: Sports Medicine

## 2015-02-03 VITALS — BP 131/70 | HR 79 | Temp 97.9°F | Resp 16 | Wt 161.0 lb

## 2015-02-03 DIAGNOSIS — M4186 Other forms of scoliosis, lumbar region: Secondary | ICD-10-CM | POA: Diagnosis not present

## 2015-02-03 DIAGNOSIS — M5416 Radiculopathy, lumbar region: Secondary | ICD-10-CM

## 2015-02-03 MED ORDER — PREDNISONE 50 MG PO TABS: ORAL_TABLET | ORAL | Status: DC

## 2015-02-03 MED ORDER — CYCLOBENZAPRINE HCL 10 MG PO TABS: ORAL_TABLET | ORAL | Status: DC

## 2015-02-03 MED ORDER — MELOXICAM 15 MG PO TABS: ORAL_TABLET | ORAL | Status: DC

## 2015-02-03 NOTE — Progress Notes (Signed)
   Subjective:    I'm seeing this patient as a consultation for:  Dr. Nani Gasseratherine Metheney  CC: Low back pain  HPI: For the past 2 months this pleasant 39 year old female has had back pain worse with sitting, flexion, Valsalva with radiation down the right leg to the bottom of the foot. Worse with bending forward, no bowel or bladder dysfunction, saddle numbness, no constitutional symptoms, no trauma.  Past medical history, Surgical history, Family history not pertinant except as noted below, Social history, Allergies, and medications have been entered into the medical record, reviewed, and no changes needed.   Review of Systems: No headache, visual changes, nausea, vomiting, diarrhea, constipation, dizziness, abdominal pain, skin rash, fevers, chills, night sweats, weight loss, swollen lymph nodes, body aches, joint swelling, muscle aches, chest pain, shortness of breath, mood changes, visual or auditory hallucinations.   Objective:   General: Well Developed, well nourished, and in no acute distress.  Neuro/Psych: Alert and oriented x3, extra-ocular muscles intact, able to move all 4 extremities, sensation grossly intact. Skin: Warm and dry, no rashes noted.  Respiratory: Not using accessory muscles, speaking in full sentences, trachea midline.  Cardiovascular: Pulses palpable, no extremity edema. Abdomen: Does not appear distended. Back Exam:  Inspection: Unremarkable  Motion: Flexion 45 deg, Extension 45 deg, Side Bending to 45 deg bilaterally,  Rotation to 45 deg bilaterally  SLR laying: Negative  XSLR laying: Negative  Palpable tenderness: None. FABER: negative. Sensory change: Gross sensation intact to all lumbar and sacral dermatomes.  Reflexes: 2+ at both patellar tendons, 2+ at achilles tendons, Babinski's downgoing.  Strength at foot  Plantar-flexion: 5/5 Dorsi-flexion: 5/5 Eversion: 5/5 Inversion: 5/5  Leg strength  Quad: 5/5 Hamstring: 5/5 Hip flexor: 5/5 Hip abductors:  5/5  Gait unremarkable.  X-rays show mild diffuse degenerative disc disease  Impression and Recommendations:   This case required medical decision making of moderate complexity.

## 2015-02-03 NOTE — Assessment & Plan Note (Signed)
2 months of symptoms, right L5 distribution radiculopathy,physical therapy, x-rays, prednisone, Flexeril at bedtime, meloxicam.  Return in one month, MRI for interventional injection planning if no better.

## 2015-02-13 ENCOUNTER — Ambulatory Visit: Payer: Self-pay | Admitting: Rehabilitative and Restorative Service Providers"

## 2015-02-28 ENCOUNTER — Ambulatory Visit (INDEPENDENT_AMBULATORY_CARE_PROVIDER_SITE_OTHER): Payer: PRIVATE HEALTH INSURANCE | Admitting: Sports Medicine

## 2015-02-28 VITALS — BP 117/77 | HR 67 | Resp 18 | Wt 161.2 lb

## 2015-02-28 DIAGNOSIS — M5416 Radiculopathy, lumbar region: Secondary | ICD-10-CM | POA: Diagnosis not present

## 2015-02-28 NOTE — Assessment & Plan Note (Signed)
Moderate improvement with prednisone, some swelling with meloxicam. Will do physical therapy in [place] and return to see me in 4 weeks, MRI for interventional planning if no better. Right L5 clinical distribution radiculopathy.

## 2015-02-28 NOTE — Progress Notes (Signed)
  Subjective:    CC: follow-up  HPI: Right lumbar radiculopathy: Has not yet done physical therapy, prednisone did provided good relief of symptoms.  Past medical history, Surgical history, Family history not pertinant except as noted below, Social history, Allergies, and medications have been entered into the medical record, reviewed, and no changes needed.   Review of Systems: No fevers, chills, night sweats, weight loss, chest pain, or shortness of breath.   Objective:    General: Well Developed, well nourished, and in no acute distress.  Neuro: Alert and oriented x3, extra-ocular muscles intact, sensation grossly intact.  HEENT: Normocephalic, atraumatic, pupils equal round reactive to light, neck supple, no masses, no lymphadenopathy, thyroid nonpalpable.  Skin: Warm and dry, no rashes. Cardiac: Regular rate and rhythm, no murmurs rubs or gallops, no lower extremity edema.  Respiratory: Clear to auscultation bilaterally. Not using accessory muscles, speaking in full sentences.  Impression and Recommendations:

## 2015-11-07 ENCOUNTER — Ambulatory Visit (INDEPENDENT_AMBULATORY_CARE_PROVIDER_SITE_OTHER): Payer: PRIVATE HEALTH INSURANCE | Admitting: Family Medicine

## 2015-11-07 ENCOUNTER — Encounter: Payer: Self-pay | Admitting: Family Medicine

## 2015-11-07 VITALS — BP 117/68 | HR 66 | Ht 68.0 in | Wt 162.0 lb

## 2015-11-07 DIAGNOSIS — J45990 Exercise induced bronchospasm: Secondary | ICD-10-CM

## 2015-11-07 DIAGNOSIS — J209 Acute bronchitis, unspecified: Secondary | ICD-10-CM

## 2015-11-07 MED ORDER — AZITHROMYCIN 250 MG PO TABS
ORAL_TABLET | ORAL | 0 refills | Status: AC
Start: 2015-11-07 — End: 2015-11-12

## 2015-11-07 MED ORDER — ALBUTEROL SULFATE HFA 108 (90 BASE) MCG/ACT IN AERS: 2.0000 | INHALATION_SPRAY | Freq: Four times a day (QID) | RESPIRATORY_TRACT | 1 refills | Status: DC | PRN

## 2015-11-07 NOTE — Progress Notes (Signed)
   Subjective:    Patient ID: Marissa Schwartz, female    DOB: 01/02/1976, 40 y.o.   MRN: [id number]  HPI Cough x 2 wks -  she reports that for 8 days the cough had been dry. Then she began experiencing yellow/green phleghm past 6 days.  Feels like she is getting worse.   lymph nodes feel a little swollen an the cough is worse at night. she has only used nyquil. denies any fever or chills.   No GI symptoms. No worsening or alleviating factors.  She has been traveling within the [place] in the last couple of weeks. She did try using her albuterol but says it was expired. She hasn't had any wheezing but has just been coughing to the point that her chest and throat hurts.   Review of Systems     Objective:   Physical Exam  Constitutional: She is oriented to person, place, and time. She appears well-developed and well-nourished.  HENT:  Head: Normocephalic and atraumatic.  Right Ear: External ear normal.  Left Ear: External ear normal.  Nose: Nose normal.  Mouth/Throat: Oropharynx is clear and moist.  TMs and canals are clear.   Eyes: Conjunctivae and EOM are normal. Pupils are equal, round, and reactive to light.  Neck: Neck supple. No thyromegaly present.  Cardiovascular: Normal rate, regular rhythm and normal heart sounds.   Pulmonary/Chest: Effort normal and breath sounds normal. She has no wheezes.  Lymphadenopathy:    She has no cervical adenopathy.  Neurological: She is alert and oriented to person, place, and time.  Skin: Skin is warm and dry.  Psychiatric: She has a normal mood and affect.          Assessment & Plan:  Acute bronchitis x 2 weeks and getting worse. Will tx with azithro. Ok to use nyquail at night since helping her sleep.  Call if not better in one week or call sooner if worse.   Exercise induced asthma - will refill her alulbterol.

## 2016-02-08 HISTORY — PX: COLPOSCOPY: SHX161

## 2016-02-21 ENCOUNTER — Encounter: Payer: Self-pay | Admitting: Family Medicine

## 2016-03-01 ENCOUNTER — Encounter: Payer: Self-pay | Admitting: Family Medicine

## 2016-03-22 ENCOUNTER — Encounter: Payer: Self-pay | Admitting: Family Medicine

## 2016-06-18 ENCOUNTER — Encounter: Payer: Self-pay | Admitting: Family Medicine

## 2016-06-18 ENCOUNTER — Ambulatory Visit (INDEPENDENT_AMBULATORY_CARE_PROVIDER_SITE_OTHER): Payer: PRIVATE HEALTH INSURANCE | Admitting: Family Medicine

## 2016-06-18 DIAGNOSIS — S9032XA Contusion of left foot, initial encounter: Secondary | ICD-10-CM | POA: Insufficient documentation

## 2016-06-18 MED ORDER — DICLOFENAC SODIUM 1 % TD GEL: 2.0000 g | Freq: Four times a day (QID) | TRANSDERMAL | 11 refills | Status: DC
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
## 2016-06-18 NOTE — Patient Instructions (Signed)
Thank you for coming in today. Apply voltaren gel 4x daily.  Do the heel stretch calf exercises.  Run when feeling better.  Do not run if you are limping.  Limit pain to no more than 3/10.  If you HAVE to exercise to stay sane cycling or exercise bike is likely to be ok.  Do intervals on the bike.

## 2016-09-01 ENCOUNTER — Telehealth: Payer: Self-pay | Admitting: Emergency Medicine

## 2016-09-01 ENCOUNTER — Emergency Department
Admission: EM | Admit: 2016-09-01 | Discharge: 2016-09-01 | Disposition: A | Discharge status: Home / Self Care | Payer: PRIVATE HEALTH INSURANCE | Source: Home / Self Care | Attending: Family Medicine | Admitting: Family Medicine

## 2016-09-01 ENCOUNTER — Encounter: Payer: Self-pay | Admitting: Emergency Medicine

## 2016-09-01 DIAGNOSIS — H18891 Other specified disorders of cornea, right eye: Secondary | ICD-10-CM

## 2016-09-01 DIAGNOSIS — H18821 Corneal disorder due to contact lens, right eye: Secondary | ICD-10-CM

## 2016-09-01 MED ORDER — GENTAMICIN SULFATE 0.3 % OP SOLN: 1.0000 [drp] | OPHTHALMIC | 0 refills | Status: DC

## 2016-09-01 NOTE — Telephone Encounter (Signed)
Gave patient information R/T follow up with Ophthalmology in [place].

## 2016-09-01 NOTE — ED Triage Notes (Signed)
Patient states she went to sleep with her contact in and woke up a rubbed her eye, now eye appears red and draining. C/O a pressure type pain 5/10. Vision with glasses Left 20/20, Right 20/20, Both 20/15.

## 2016-09-01 NOTE — ED Provider Notes (Signed)
CSN: [id number]     Arrival date & time 09/01/16  1644 History   First MD Initiated Contact with Patient 09/01/16 1723     Chief Complaint  Patient presents with  . Eye Injury   (Consider location/radiation/quality/duration/timing/severity/associated sxs/prior Treatment) HPI Marissa Schwartz is a 41 y.o. female presenting to UC with c/o Right eye irritation, redness, and drainage that started this afternoon after she accidentally fell asleep for about 30 minutes with her contacts in. Pt states she rubbed her eyes then symptoms started. She was able to remove her contacts but still c/o symptoms.  She has fallen asleep with her contacts before without reaction.  Denies other symptoms.    History reviewed. No pertinent past medical history. Past Surgical History:  Procedure Laterality Date  . COLPOSCOPY  02/08/2016  . TONSILLECTOMY AND ADENOIDECTOMY  1979  . vein sclerotherapy     Family History  Problem Relation Age of Onset  . Hypertension Mother   . Hyperlipidemia Father   . Lung cancer Maternal Grandfather        smoker  . Heart attack Maternal Grandfather   . ALS Maternal Grandmother    Social History  Substance Use Topics  . Smoking status: Former Smoker    Quit date: 02/04/1993  . Smokeless tobacco: Never Used  . Alcohol use Yes     Comment: rarely   OB History    No data available     Review of Systems  Constitutional: Negative for chills and fever.  HENT: Negative for congestion and rhinorrhea.   Eyes: Positive for pain, discharge, redness and itching. Negative for photophobia and visual disturbance.  Respiratory: Negative for cough.   Neurological: Negative for dizziness, light-headedness and headaches.    Allergies  Patient has no known allergies.  Home Medications   Prior to Admission medications   Medication Sig Start Date End Date Taking? Authorizing Provider  albuterol (PROVENTIL HFA) 108 (90 Base) MCG/ACT inhaler Inhale 2 puffs into the lungs every  6 (six) hours as needed. Patient not taking: Reported on 06/18/2016 11/07/15   [name], Anetta D, MD  diclofenac sodium (VOLTAREN) 1 % GEL Apply 2 g topically 4 (four) times daily. To affected joint. 06/18/16   Rodolph Bongorey, Evan S, MD  gentamicin (GARAMYCIN) 0.3 % ophthalmic solution Place 1 drop into the right eye every 4 (four) hours. For 5 days 09/01/16   Lurene ShadowPhelps, Aneta Hendershott O, PA-C   Meds Ordered and Administered this Visit  Medications - No data to display  BP 101/70 (BP Location: Left Arm)   Pulse 76   Temp 98.7 F (37.1 C) (Oral)   Resp 16   Ht 5\' 9"  (1.753 m)   Wt 169 lb (76.7 kg)   LMP 08/18/2016   SpO2 100%   BMI 24.96 kg/m  No data found.   Physical Exam  Constitutional: She is oriented to person, place, and time. She appears well-developed and well-nourished. No distress.  HENT:  Head: Normocephalic and atraumatic.  Eyes: Pupils are equal, round, and reactive to light. EOM are normal. Right eye exhibits chemosis and discharge (clear). No foreign body present in the right eye. Right conjunctiva is injected.  Right eye: no fluorescein uptake.   Neck: Normal range of motion.  Cardiovascular: Normal rate.   Pulmonary/Chest: Effort normal.  Musculoskeletal: Normal range of motion.  Neurological: She is alert and oriented to person, place, and time.  Skin: Skin is warm and dry. She is not diaphoretic.  Psychiatric: She has a normal  mood and affect. Her behavior is normal.  Nursing note and vitals reviewed.   Urgent Care Course     Procedures (including critical care time)  Labs Review Labs Reviewed - No data to display  Imaging Review No results found.   Visual Acuity Review- with glasses  Right Eye Distance: 20/20 Left Eye Distance: 20/20 Bilateral Distance: 20/15   MDM   1. Corneal irritation of right eye   2. Contact lens overwear, right    No evidence of corneal abrasion but she does have chemosis in Right eye.  Rx: Gentamicin ophthalmic drops Encouraged  to wear glasses until symptoms resolve F/u with ophthalmologist in morning if symptoms worsen or if symptoms not improving in 3-4 days.   Use new pair of contact lenses once symptoms resolve.     Lurene Shadowhelps, Ariannah Arenson O, [place] JerseyPA-C 09/01/16 [phone]

## 2016-09-03 ENCOUNTER — Telehealth: Payer: Self-pay

## 2016-09-03 NOTE — Telephone Encounter (Signed)
Left VM to call UC or PCP if any questions or concerns.

## 2016-10-04 ENCOUNTER — Encounter: Payer: Self-pay | Admitting: Family Medicine

## 2016-10-04 ENCOUNTER — Ambulatory Visit (INDEPENDENT_AMBULATORY_CARE_PROVIDER_SITE_OTHER): Payer: PRIVATE HEALTH INSURANCE | Admitting: Family Medicine

## 2016-10-04 VITALS — BP 120/65 | HR 69 | Wt 165.0 lb

## 2016-10-04 DIAGNOSIS — L509 Urticaria, unspecified: Secondary | ICD-10-CM | POA: Diagnosis not present

## 2016-10-04 DIAGNOSIS — R635 Abnormal weight gain: Secondary | ICD-10-CM

## 2016-10-04 DIAGNOSIS — R5383 Other fatigue: Secondary | ICD-10-CM | POA: Diagnosis not present

## 2016-10-04 LAB — CBC WITH DIFFERENTIAL/PLATELET
BASOS PCT: 1 %
Basophils Absolute: 62 cells/uL (ref 0–200)
Eosinophils Absolute: 62 cells/uL (ref 15–500)
Eosinophils Relative: 1 %
HEMATOCRIT: 37.6 % (ref 35.0–45.0)
Hemoglobin: 12.4 g/dL (ref 11.7–15.5)
LYMPHS PCT: 30 %
Lymphs Abs: 1860 cells/uL (ref 850–3900)
MCH: 30.1 pg (ref 27.0–33.0)
MCHC: 33 g/dL (ref 32.0–36.0)
MCV: 91.3 fL (ref 80.0–100.0)
MONO ABS: 434 {cells}/uL (ref 200–950)
MPV: 11.7 fL (ref 7.5–12.5)
Monocytes Relative: 7 %
Neutro Abs: 3782 cells/uL (ref 1500–7800)
Neutrophils Relative %: 61 %
PLATELETS: 234 10*3/uL (ref 140–400)
RBC: 4.12 MIL/uL (ref 3.80–5.10)
RDW: 13.1 % (ref 11.0–15.0)
WBC: 6.2 10*3/uL (ref 3.8–10.8)

## 2016-10-04 MED ORDER — ALBUTEROL SULFATE HFA 108 (90 BASE) MCG/ACT IN AERS: 2.0000 | INHALATION_SPRAY | Freq: Four times a day (QID) | RESPIRATORY_TRACT | 1 refills | Status: DC | PRN

## 2016-10-04 NOTE — Progress Notes (Signed)
Subjective:    Patient ID: Marissa Schwartz, female    DOB: July 30, 1975, 41 y.o.   MRN: [id number]  HPI 41 year old female comes in today to discuss weight gain.  She says she has been training for a marathon. She got into the [employer] . She has been training for 9 weeks and the marathon is in  9 weeks. She really wanted to lose weight and slim down to improve her running time. She has been tracking her calories with an app. She is only eating 1600-1800 Calories per day. They have asked that she should probably be eating more like 2500 cal a day especially with the amount of exercise that she's doing. She says she is not consuming alcohol. And is eating a low carb diet. She does have a family history of thyroid disease. Both her mother and father have had thyroid problems. Also more recently she's been breaking out in hives on her forearms and sometimes on her abdomen. She says it typically happens towards the end of exercise her right after exercise. It usually resolves after 60-90 minutes. It started about 3-4 weeks ago. It never affects her face or lower legs. And it seems to happen more when it's really hot outside during her run. Note, she has lost 4 pounds since she was here in July.  Her goal weight is 155 pounds.   Review of Systems  BP 120/65   Pulse 69   Wt 165 lb (74.8 kg)   SpO2 100%   BMI 24.37 kg/m     No Known Allergies  No past medical history on file.  Past Surgical History:  Procedure Laterality Date  . COLPOSCOPY  02/08/2016  . TONSILLECTOMY AND ADENOIDECTOMY  1979  . vein sclerotherapy      Social History   Social History  . Marital status: Legally Separated    Spouse name: N/A  . Number of children: 2  . Years of education: N/A   Occupational History  . Med [employer]   Social History Main Topics  . Smoking status: Former Smoker    Quit date: 02/04/1993  . Smokeless tobacco: Never Used  . Alcohol use Yes     Comment: rarely  . Drug use: No  .  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   Other Topics Concern  . Not on file   Social History Narrative   REgular exercise.     Family History  Problem Relation Age of Onset  . Hypertension Mother   . Hyperlipidemia Father   . Lung cancer Maternal Grandfather        smoker  . Heart attack Maternal Grandfather   . ALS Maternal Grandmother     Outpatient Encounter Prescriptions as of 10/04/2016  Medication Sig  . albuterol (PROVENTIL HFA) 108 (90 Base) MCG/ACT inhaler Inhale 2 puffs into the lungs every 6 (six) hours as needed.  . [DISCONTINUED] albuterol (PROVENTIL HFA) 108 (90 Base) MCG/ACT inhaler Inhale 2 puffs into the lungs every 6 (six) hours as needed.  . [DISCONTINUED] diclofenac sodium (VOLTAREN) 1 % GEL Apply 2 g topically 4 (four) times daily. To affected joint.  . [DISCONTINUED] gentamicin (GARAMYCIN) 0.3 % ophthalmic solution Place 1 drop into the right eye every 4 (four) hours. For 5 days   No facility-administered encounter medications on file as of 10/04/2016.          Objective:  Physical Exam  Constitutional: She is oriented to person, place, and time. She appears well-developed and well-nourished.  HENT:  Head: Normocephalic and atraumatic.  Cardiovascular: Normal rate, regular rhythm and normal heart sounds.   Pulmonary/Chest: Effort normal and breath sounds normal.  Neurological: She is alert and oriented to person, place, and time.  Skin: Skin is warm and dry.  Psychiatric: She has a normal mood and affect. Her behavior is normal.          Assessment & Plan:  Abnormal weight gain -  Discussed options. At this point I like to go ahead and refer her to nutritionist to maybe specializes in athletes. In addition I do think she is actually under eating some encouraged her to increase her calories to at least 2000 per day by adding in and maybe a high-protein snack and try this for the  next 2 weeks and see if it improves. Will also so do blood work to rule out thyroid disorder as well as anemia.  Hives - likely exercise induced.  I don't see anything worrisome for exposure to foods etc. that might be causing this. It resolves pretty quickly after exercise. It can be worsened by taking a hot shower. Gave reassurance. Consider antihistamine if needed before exercise.  Fatigue - see note above.  [employer] as well.

## 2016-10-05 LAB — VITAMIN B12: Vitamin B-12: 330 pg/mL (ref 200–1100)

## 2016-10-05 LAB — TSH: TSH: 0.76 m[IU]/L

## 2016-10-05 LAB — FERRITIN: Ferritin: 13 ng/mL (ref 10–232)

## 2017-02-18 LAB — HM MAMMOGRAPHY

## 2017-02-20 LAB — HM PAP SMEAR: HM Pap smear: NEGATIVE

## 2017-03-17 ENCOUNTER — Encounter: Payer: Self-pay | Admitting: Family Medicine

## 2017-03-28 ENCOUNTER — Encounter: Payer: Self-pay | Admitting: Family Medicine

## 2017-06-17 ENCOUNTER — Ambulatory Visit (INDEPENDENT_AMBULATORY_CARE_PROVIDER_SITE_OTHER): Payer: PRIVATE HEALTH INSURANCE

## 2017-06-17 ENCOUNTER — Ambulatory Visit: Payer: PRIVATE HEALTH INSURANCE | Admitting: Sports Medicine

## 2017-06-17 ENCOUNTER — Encounter: Payer: Self-pay | Admitting: Sports Medicine

## 2017-06-17 DIAGNOSIS — M222X2 Patellofemoral disorders, left knee: Secondary | ICD-10-CM | POA: Diagnosis not present

## 2017-06-17 MED ORDER — DICLOFENAC SODIUM 2 % TD SOLN: 2.0000 | Freq: Two times a day (BID) | TRANSDERMAL | 11 refills | Status: DC

## 2017-06-17 NOTE — Patient Instructions (Signed)
Hip Rehabilitation Protocol:  1.  Side leg raises.  3x30 with no weight, then 3x15 with 2 lb ankle weight, then 3x15 with 5 lb ankle weight 2.  Standing hip rotation.  3x30 with no weight, then 3x15 with 2 lb ankle weight, then 3x15 with 5 lb ankle weight. 3.  Side step ups.  3x30 with no weight, then 3x15 with 5 lbs in backpack, then 3x15 with 10 lbs in backpack. 

## 2017-06-17 NOTE — Assessment & Plan Note (Signed)
Topical Pennsaid (diclofenac 2% topical), x-rays. Referral to physical therapy to learn McConnell taping of the knee. Hip rehab exercises, return for custom molded orthotics. She is running the [employer]	[place] marathon in October.  She has done 2 previous marathons.

## 2017-06-17 NOTE — Progress Notes (Signed)
Subjective:    I'm seeing this patient as a consultation for:  Nani Gasser, MD  CC: left  knee tenderness from running  HPI: Marissa Schwartz, a 2x marathon runner, is a 42yo female presenting in clinic today with left knee pain that has progressively worsened over the last 2-3 months.  Pt describes pain as a tightness/burning over her anterior left knee that worsens with activity (squatting).  Pt reports that during squatting anterior/lateral left knee pain peaks at a 6/10.  Pt reports that Advil and aleve have brought some relief while naproxen has brought relief but also caused systemic swelling.  Pt reports that applying heat has also helped alleviate her left knee pain.  Pt reports that she is currently running 20 miles a week for her training.  Pt reports that she experienced similar pain last November (2018) after completing her 1st marathon but the pain  (along with other general aches) resolved spontaneously.     I reviewed the past medical history, family history, social history, surgical history, and allergies today and no changes were needed.  Please see the problem list section below in epic for further details.  Past Medical History: No past medical history on file. Past Surgical History: Past Surgical History:  Procedure Laterality Date  . COLPOSCOPY  02/08/2016  . TONSILLECTOMY AND ADENOIDECTOMY  1979  . TUBAL LIGATION    . vein sclerotherapy     Social History: Social History   Socioeconomic History  . Marital status: Legally Separated    Spouse name: Not on file  . Number of children: 2  . Years of education: Not on file  . Highest education level: Not on file  Occupational History  . 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 Financial resource strain: Not on file  . Food insecurity:    Worry: Not on file    Inability: Not on file  . Transportation needs:    Medical: Not on file    Non-medical: Not on file  Tobacco Use  . Smoking  status: Former Smoker    Last attempt to quit: 02/04/1993    Years since quitting: 24.3  . Smokeless tobacco: Never Used  Substance and Sexual Activity  . Alcohol use: Yes    Comment: rarely  . Drug use: No  . 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  Lifestyle  . Physical activity:    Days per week: Not on file    Minutes per session: Not on file  . Stress: Not on file  Relationships  . Social connections:    Talks on phone: Not on file    Gets together: Not on file    Attends religious service: Not on file    Active member of club or organization: Not on file    Attends meetings of clubs or organizations: Not on file    Relationship status: Not on file  Other Topics Concern  . Not on file  Social History Narrative   REgular exercise.    Family History: Family History  Problem Relation Age of Onset  . Hypertension Mother   . Hyperlipidemia Father   . Lung cancer Maternal Grandfather        smoker  . Heart attack Maternal Grandfather   . ALS Maternal Grandmother    Allergies: No Known Allergies Medications: See med rec.  Review of Systems: No headache, visual changes, nausea, vomiting,  diarrhea, constipation, dizziness, abdominal pain, skin rash, fevers, chills, night sweats, weight loss, swollen lymph nodes, body aches, joint swelling, muscle aches, chest pain, shortness of breath, mood changes, visual or auditory hallucinations.   Objective:   General: Well Developed, well nourished, and in no acute distress.  Neuro:  Extra-ocular muscles intact, able to move all 4 extremities, sensation grossly intact.  Deep tendon reflexes tested were normal. Psych: Alert and oriented, mood congruent with affect. ENT:  Ears and nose appear unremarkable.  Hearing grossly normal. Neck: Unremarkable overall appearance, trachea midline.  No visible thyroid enlargement. Eyes: Conjunctivae and lids appear unremarkable.   Pupils equal and round. Skin: Warm and dry, no rashes noted.  Cardiovascular: Pulses palpable, no extremity edema.  Left Knee: Normal to inspection with no erythema or effusion or obvious bony abnormalities. patellar tenderness with Palpation ; else palpation normal but no warmth, joint line tenderness or condyle tenderness. ROM full in flexion and extension and lower leg rotation. Pain over anterior and lateral knee with deep flexion  Ligaments with solid consistent endpoints including ACL, PCL, LCL, MCL. Some Painful patellar compression. Patellar glide with crepitus. Patellar and quadriceps tendons unremarkable. Hamstring and quadriceps strength is normal.   Hip: Abduction diminished in the left leg when compared to the right.  Impression and Recommendations:   This case required medical decision making of moderate complexity.  Assessment- Left Patellofemoral Syndrome  Pt was prescribed a topic antiinflammatory agent (PENSAID) to help alleviate left knee pain. Pt was educated on her diagnosis and given an educational handout with rehabilitative exercises to carry out nightly. Physican also demonstrated specific hip rehabilitative exercises for pt to complete nightly.   Pt was referred to complete 1 Session of Formal Physical therapy to learn how to execute McConnell taping so as to provide further stability to the patella and to learn useful exercises that will maintain range of motion as well as strength as she continues to marathon train. Pt was also advised to set an appointment to have custom orthotics made.    ___________________________________________ Ihor Austin. Benjamin Stain, M.D., ABFM., CAQSM. Primary Care and Sports Medicine [place] [facility]  Adjunct Instructor of Family Medicine  [employer]

## 2017-06-26 ENCOUNTER — Encounter: Payer: Self-pay | Admitting: Sports Medicine

## 2017-06-26 ENCOUNTER — Ambulatory Visit (INDEPENDENT_AMBULATORY_CARE_PROVIDER_SITE_OTHER): Payer: PRIVATE HEALTH INSURANCE | Admitting: Sports Medicine

## 2017-06-26 DIAGNOSIS — M222X2 Patellofemoral disorders, left knee: Secondary | ICD-10-CM

## 2017-06-26 NOTE — Assessment & Plan Note (Signed)
No much improvement with topical Pennsaid (diclofenac 2% topical), arthritis on x-rays. Continue rehab exercises, custom orthotics as above. [employer]	[place] marathon in October.

## 2017-06-26 NOTE — Progress Notes (Signed)
    Patient was fitted for a : standard, cushioned, semi-rigid orthotic. The orthotic was heated and afterward the patient stood on the orthotic blank positioned on the orthotic stand. The patient was positioned in subtalar neutral position and 10 degrees of ankle dorsiflexion in a weight bearing stance. After completion of molding, a stable base was applied to the orthotic blank. The blank was ground to a stable position for weight bearing. Size: Base: [place], hospital and Padding: None The patient ambulated these, and they were very comfortable.  I spent 40 minutes with this patient, greater than 50% was face-to-face time counseling regarding the below diagnosis, including management protocol, prognosis..  ___________________________________________ Ihor Austin. Benjamin Stain, M.D., ABFM., CAQSM. Primary Care and Sports Medicine [place] [facility]  Adjunct Instructor of Family Medicine  [employer]

## 2017-07-02 ENCOUNTER — Encounter: Payer: Self-pay | Admitting: Physical Therapy

## 2017-07-02 ENCOUNTER — Ambulatory Visit (INDEPENDENT_AMBULATORY_CARE_PROVIDER_SITE_OTHER): Payer: PRIVATE HEALTH INSURANCE | Admitting: Physical Therapy

## 2017-07-02 DIAGNOSIS — M25562 Pain in left knee: Secondary | ICD-10-CM

## 2017-07-02 DIAGNOSIS — M6281 Muscle weakness (generalized): Secondary | ICD-10-CM | POA: Diagnosis not present

## 2017-07-02 NOTE — Therapy (Signed)
[facility] [place] [address] [place], [place], [address] Phone: [phone]   Fax:  [phone]  Physical Therapy Evaluation  Patient Details  Name: Marissa Schwartz MRN: [id number] Date of Birth: 30-Jan-1976 Referring Provider: Dr Benjamin Stain   Encounter Date: 07/02/2017  PT End of Session - 07/02/17 1521    Visit Number  1    PT Start Time  1521    PT Stop Time  1605    PT Time Calculation (min)  44 min       History reviewed. No pertinent past medical history.  Past Surgical History:  Procedure Laterality Date  . COLPOSCOPY  02/08/2016  . TONSILLECTOMY AND ADENOIDECTOMY  1979  . TUBAL LIGATION    . vein sclerotherapy      There were no vitals filed for this visit.   Subjective Assessment - 07/02/17 1521    Subjective  Pt is training for a marathon in October, currently running about 20' a week, Mainly pavement. Has developed Lt knee pain about 2 months ago, squating makes it the worse. Performing HS and quad stretch, sidelyng hip abduction, standing hip circles, curtsey lunges of stairs - these were all given to her by the doctor.    Patient Stated Goals  run and squat without pain.     Currently in Pain?  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                 Objective measurements completed on examination: See above findings.      [facility] Adult PT Treatment/Exercise - 07/02/17 0001      Exercises   Exercises  Knee/Hip      Knee/Hip Exercises: Standing   SLS  with FWD leans, sit to/from stand      Knee/Hip  Exercises: Sidelying   Other Sidelying Knee/Hip Exercises  pilates FWD/BWD kicks, CW/CCW circles, taps FWD/BWD      Manual Therapy   Manual Therapy  Taping    Manual therapy comments  dynamic taping to Lt knee for lateral tracking - pt instructed in how to apply and took pictures.              PT Education - 07/02/17 1604    Education provided  Yes    Education Details  dynamic tape and HEP    Person(s) Educated  Patient    Methods  Explanation;Demonstration;Handout    Comprehension  Returned demonstration;Verbalized understanding          PT Long Term Goals - 07/02/17 1616      PT LONG TERM GOAL #1   Title  Verbalize and demonstrate I with HEP     Time  -- at eval    Status  Achieved      PT LONG TERM GOAL #2   Title  verbalize understanding of taping for knee     Time  -- at eval    Status  Achieved             Plan - 07/02/17 1614    Clinical Impression Statement  42 yo runner with ~ 2 month h/o Lt knee pain. She was referred to PT for a one time visit to learn how to tape her knee for lateral patellar tracking. She was also instructed in a HEP to further protect her  joints as she progresses her running distances.     Clinical Presentation  Stable    Clinical Decision Making  Low    Rehab Potential  Excellent    PT Frequency  One time visit    PT Treatment/Interventions  Therapeutic exercise;Taping    PT Next Visit Plan  one time visit    Consulted and Agree with Plan of Care  Patient       Patient will benefit from skilled therapeutic intervention in order to improve the following deficits and impairments:  Pain, Decreased strength  Visit Diagnosis: Acute pain of left knee - Plan: PT plan of care cert/re-cert  Muscle weakness (generalized) - Plan: PT plan of care cert/re-cert     Problem List Patient Active Problem List   Diagnosis Date Noted  . Patellofemoral syndrome of left knee 06/17/2017  . Contusion of heel, left, initial encounter 06/18/2016  . Right lumbar radiculopathy 02/03/2015  . Numbness of toes 06/08/2013  . EXTERNAL HEMORRHOIDS, THROMBOSED 10/04/2010  . GERD 09/12/2009  . KNEE PAIN 08/25/2008  . Asthma, exercise induced 07/18/2008    Roderic Scarce PT  07/02/2017, 4:19 PM  [facility] [address] [place], [place], [address] Phone: [phone]   Fax:  [phone]  Name: Marissa Schwartz MRN: [id number] Date of Birth: 1975/10/08

## 2017-07-02 NOTE — Patient Instructions (Signed)
Dynamic tape for the knees to improve knee cap motion. 3" tape would work best.   Use about a 50% pull on the "K" side of tape making a C around the knee cap Use about a 75% pull on the cross piece.    NO TENSION on the last 1-1.5" of tape.

## 2017-07-28 ENCOUNTER — Ambulatory Visit: Payer: PRIVATE HEALTH INSURANCE | Admitting: Sports Medicine

## 2017-08-11 ENCOUNTER — Encounter: Payer: Self-pay | Admitting: Sports Medicine

## 2017-08-11 ENCOUNTER — Ambulatory Visit (INDEPENDENT_AMBULATORY_CARE_PROVIDER_SITE_OTHER): Payer: PRIVATE HEALTH INSURANCE | Admitting: Sports Medicine

## 2017-08-11 DIAGNOSIS — M222X2 Patellofemoral disorders, left knee: Secondary | ICD-10-CM

## 2017-08-11 NOTE — Assessment & Plan Note (Signed)
Having some bilateral pain now albeit significantly better with custom orthotics, taping, physical therapy. 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 Ultimately she has decided not to run the [employer] marathon, she is going to continue running approximately 8 miles, and I have advised her to use softer running surfaces such as grass and dirt rather than sidewalks of the road. She can return to see me on an as-needed basis.

## 2017-08-11 NOTE — Progress Notes (Signed)
Subjective:    CC: Follow-up  HPI: Patellofemoral syndrome: With mild osteoarthritis on x-rays, at this point she does not desire to pursue the [employer] marathon, pain is improved considerably with custom orthotics and rehab exercises, topical Pennsaid (diclofenac 2% topical) was not effective.  I reviewed the past medical history, family history, social history, surgical history, and allergies today and no changes were needed.  Please see the problem list section below in epic for further details.  Past Medical History: No past medical history on file. Past Surgical History: Past Surgical History:  Procedure Laterality Date  . COLPOSCOPY  02/08/2016  . TONSILLECTOMY AND ADENOIDECTOMY  1979  . TUBAL LIGATION    . vein sclerotherapy     Social History: Social History   Socioeconomic History  . Marital status: Legally Separated    Spouse name: Not on file  . Number of children: 2  . Years of education: Not on file  . Highest education level: Not on file  Occupational History  . Occupation: Med [employer]     Comment: [employer]  Social Needs  . Financial resource strain: Not on file  . Food insecurity:    Worry: Not on file    Inability: Not on file  . Transportation needs:    Medical: Not on file    Non-medical: Not on file  Tobacco Use  . Smoking status: Former Smoker    Last attempt to quit: 02/04/1993    Years since quitting: 24.5  . Smokeless tobacco: Never Used  Substance and Sexual Activity  . Alcohol use: Yes    Comment: rarely  . Drug use: No  . 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  Lifestyle  . Physical activity:    Days per week: Not on file    Minutes per session: Not on file  . Stress: Not on file  Relationships  . Social connections:    Talks on phone: Not on file    Gets together: Not on file    Attends religious service: Not on file    Active member of club or organization: Not on file      Attends meetings of clubs or organizations: Not on file    Relationship status: Not on file  Other Topics Concern  . Not on file  Social History Narrative   REgular exercise.    Family History: Family History  Problem Relation Age of Onset  . Hypertension Mother   . Hyperlipidemia Father   . Lung cancer Maternal Grandfather        smoker  . Heart attack Maternal Grandfather   . ALS Maternal Grandmother    Allergies: No Known Allergies Medications: See med rec.  Review of Systems: No fevers, chills, night sweats, weight loss, chest pain, or shortness of breath.   Objective:    General: Well Developed, well nourished, and in no acute distress.  Neuro: Alert and oriented x3, extra-ocular muscles intact, sensation grossly intact.  HEENT: Normocephalic, atraumatic, pupils equal round reactive to light, neck supple, no masses, no lymphadenopathy, thyroid nonpalpable.  Skin: Warm and dry, no rashes. Cardiac: Regular rate and rhythm, no murmurs rubs or gallops, no lower extremity edema.  Respiratory: Clear to auscultation bilaterally. Not using accessory muscles, speaking in full sentences.  Impression and Recommendations:    Patellofemoral syndrome of left knee Having some bilateral pain now albeit significantly better with custom orthotics, taping, physical therapy. X-rays did show mild  osteoarthritis. Ultimately she has decided not to run the [employer], she is going to continue running approximately 8 miles, and I have advised her to use softer running surfaces such as grass and dirt rather than sidewalks of the road. She can return to see me on an as-needed basis. ___________________________________________ Ihor Austin. Benjamin Stain, M.D., ABFM., CAQSM. Primary Care and Sports Medicine [facility]  Adjunct Instructor of Family Medicine  [employer]

## 2018-02-26 LAB — HM PAP SMEAR: HM PAP: NEGATIVE

## 2018-03-26 ENCOUNTER — Encounter: Payer: Self-pay | Admitting: Family Medicine

## 2019-09-16 IMAGING — DX DG KNEE 1-2V*R*
1 series · 1 of 1 positions shown · non-contrast
Comparison: None

CLINICAL DATA: Patellofemoral syndrome LEFT knee

EXAM:
LEFT KNEE - COMPLETE 4+ VIEW; RIGHT KNEE - 1-2 VIEW

[knee ap]
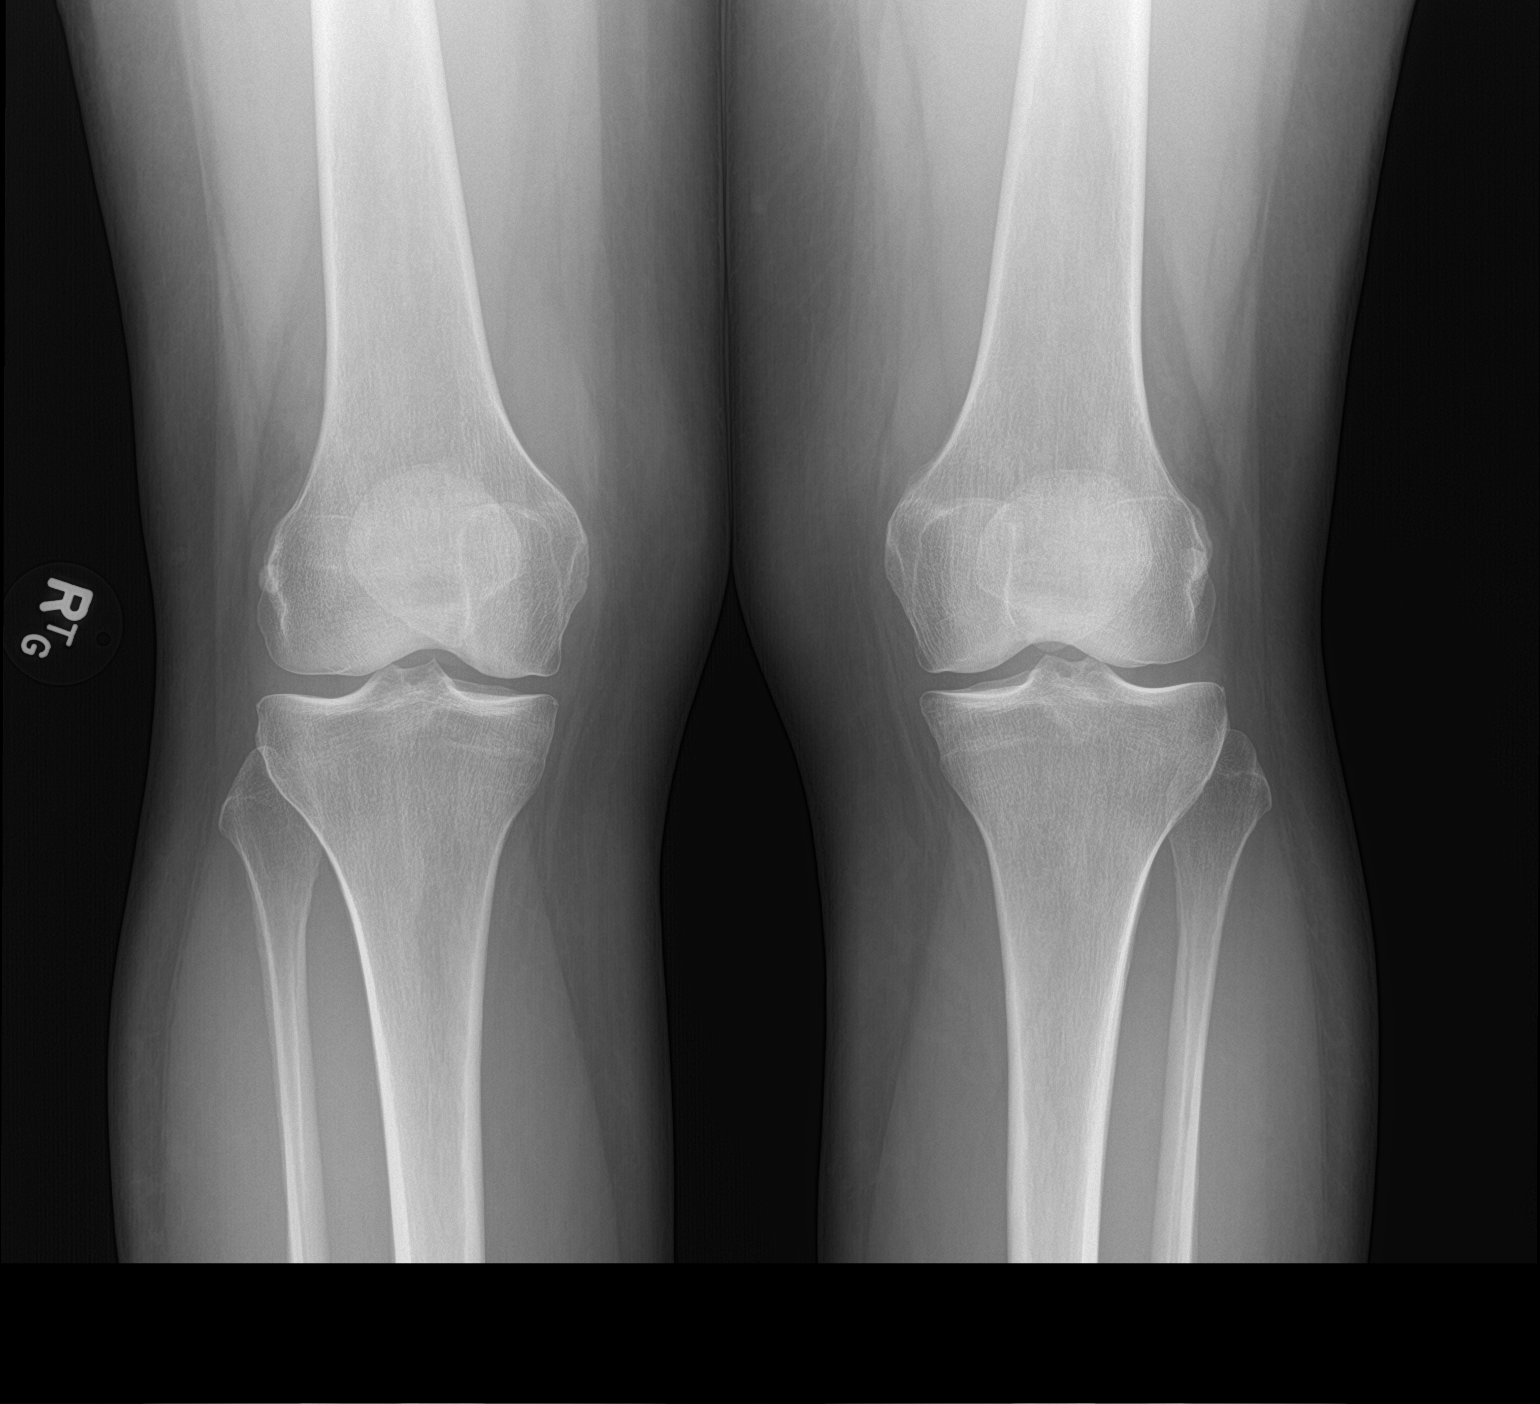

[1 of 1 positions shown; findings below may reference images not displayed]

FINDINGS: LEFT knee:

Osseous mineralization normal for technique.

Slight joint space narrowing at medial compartment.

No acute fracture, dislocation or bone destruction.

Small inferior articular patellar spur.

No knee joint effusion.

RIGHT knee:

No lateral view.

Osseous mineralization normal for technique.

Joint spaces preserved.

No gross fracture dislocation or bone destruction on AP exam.
IMPRESSION: Mild degenerative changes LEFT knee.

No acute abnormalities in either knee.
